# Patient Record
Sex: Female | Born: 1935 | Race: White | Hispanic: No | Smoking: Never smoker
Health system: Southern US, Community
[De-identification: ages and names within clinical notes are randomized; demographics above are authoritative.]

## PROBLEM LIST (undated history)

## (undated) DIAGNOSIS — M810 Age-related osteoporosis without current pathological fracture: Secondary | ICD-10-CM

## (undated) DIAGNOSIS — K219 Gastro-esophageal reflux disease without esophagitis: Secondary | ICD-10-CM

## (undated) DIAGNOSIS — I351 Nonrheumatic aortic (valve) insufficiency: Secondary | ICD-10-CM

## (undated) DIAGNOSIS — I77810 Thoracic aortic ectasia: Secondary | ICD-10-CM

## (undated) DIAGNOSIS — I350 Nonrheumatic aortic (valve) stenosis: Secondary | ICD-10-CM

## (undated) DIAGNOSIS — I2510 Atherosclerotic heart disease of native coronary artery without angina pectoris: Secondary | ICD-10-CM

## (undated) DIAGNOSIS — E785 Hyperlipidemia, unspecified: Secondary | ICD-10-CM

## (undated) DIAGNOSIS — G709 Myoneural disorder, unspecified: Secondary | ICD-10-CM

## (undated) DIAGNOSIS — R42 Dizziness and giddiness: Secondary | ICD-10-CM

## (undated) DIAGNOSIS — Z8719 Personal history of other diseases of the digestive system: Secondary | ICD-10-CM

## (undated) DIAGNOSIS — I10 Essential (primary) hypertension: Secondary | ICD-10-CM

## (undated) DIAGNOSIS — T8859XA Other complications of anesthesia, initial encounter: Secondary | ICD-10-CM

## (undated) DIAGNOSIS — M1990 Unspecified osteoarthritis, unspecified site: Secondary | ICD-10-CM

## (undated) DIAGNOSIS — E039 Hypothyroidism, unspecified: Secondary | ICD-10-CM

## (undated) DIAGNOSIS — I272 Pulmonary hypertension, unspecified: Secondary | ICD-10-CM

## (undated) DIAGNOSIS — T4145XA Adverse effect of unspecified anesthetic, initial encounter: Secondary | ICD-10-CM

## (undated) HISTORY — DX: Hyperlipidemia, unspecified: E78.5

## (undated) HISTORY — DX: Essential (primary) hypertension: I10

## (undated) HISTORY — PX: ESOPHAGOGASTRODUODENOSCOPY: SHX1529

## (undated) HISTORY — PX: TONSILLECTOMY: SUR1361

## (undated) HISTORY — DX: Thoracic aortic ectasia: I77.810

## (undated) HISTORY — DX: Nonrheumatic aortic (valve) insufficiency: I35.1

## (undated) HISTORY — PX: BACK SURGERY: SHX140

## (undated) HISTORY — DX: Pulmonary hypertension, unspecified: I27.20

## (undated) HISTORY — DX: Age-related osteoporosis without current pathological fracture: M81.0

## (undated) HISTORY — DX: Nonrheumatic aortic (valve) stenosis: I35.0

## (undated) HISTORY — PX: CARDIAC CATHETERIZATION: SHX172

---
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
## 1998-09-14 HISTORY — PX: FLEXIBLE SIGMOIDOSCOPY: SHX1649

## 1998-09-22 ENCOUNTER — Ambulatory Visit (HOSPITAL_COMMUNITY): Admission: RE | Admit: 1998-09-22 | Discharge: 1998-09-22 | Payer: Self-pay | Admitting: Gastroenterology

## 1999-02-16 ENCOUNTER — Ambulatory Visit (HOSPITAL_COMMUNITY): Admission: RE | Admit: 1999-02-16 | Discharge: 1999-02-16 | Payer: Self-pay | Admitting: Obstetrics & Gynecology

## 2000-01-30 ENCOUNTER — Other Ambulatory Visit: Admission: RE | Admit: 2000-01-30 | Discharge: 2000-01-30 | Payer: Self-pay | Admitting: Family Medicine

## 2005-08-14 HISTORY — PX: CORONARY ARTERY BYPASS GRAFT: SHX141

## 2005-12-12 HISTORY — PX: CORONARY ARTERY BYPASS GRAFT: SHX141

## 2006-01-05 ENCOUNTER — Encounter (INDEPENDENT_AMBULATORY_CARE_PROVIDER_SITE_OTHER): Payer: Self-pay | Admitting: Cardiology

## 2006-01-05 ENCOUNTER — Inpatient Hospital Stay (HOSPITAL_COMMUNITY): Admission: AD | Admit: 2006-01-05 | Discharge: 2006-01-17 | Payer: Self-pay | Admitting: Cardiology

## 2006-02-08 ENCOUNTER — Encounter (HOSPITAL_COMMUNITY): Admission: RE | Admit: 2006-02-08 | Discharge: 2006-05-09 | Payer: Self-pay | Admitting: Cardiology

## 2006-03-28 ENCOUNTER — Inpatient Hospital Stay (HOSPITAL_COMMUNITY): Admission: EM | Admit: 2006-03-28 | Discharge: 2006-03-30 | Payer: Self-pay | Admitting: Emergency Medicine

## 2006-03-29 ENCOUNTER — Encounter (INDEPENDENT_AMBULATORY_CARE_PROVIDER_SITE_OTHER): Payer: Self-pay | Admitting: Interventional Cardiology

## 2006-05-10 ENCOUNTER — Encounter (HOSPITAL_COMMUNITY): Admission: RE | Admit: 2006-05-10 | Discharge: 2006-08-08 | Payer: Self-pay | Admitting: Cardiology

## 2006-08-16 ENCOUNTER — Encounter: Admission: RE | Admit: 2006-08-16 | Discharge: 2006-08-16 | Payer: Self-pay | Admitting: Cardiology

## 2007-04-24 ENCOUNTER — Encounter
Admission: RE | Admit: 2007-04-24 | Discharge: 2007-07-23 | Payer: Self-pay | Admitting: Physical Medicine & Rehabilitation

## 2007-04-25 ENCOUNTER — Ambulatory Visit: Payer: Self-pay | Admitting: Physical Medicine & Rehabilitation

## 2007-04-30 ENCOUNTER — Encounter
Admission: RE | Admit: 2007-04-30 | Discharge: 2007-06-17 | Payer: Self-pay | Admitting: Physical Medicine & Rehabilitation

## 2007-06-07 ENCOUNTER — Ambulatory Visit: Payer: Self-pay | Admitting: Physical Medicine & Rehabilitation

## 2007-12-30 IMAGING — CR DG CHEST 1V PORT
1 series · 1 of 1 positions shown · non-contrast
Comparison: 01/11/06.

CLINICAL DATA: CABG.
 PORTABLE CHEST - 1 VIEW (2062 hours):

[view not recorded]
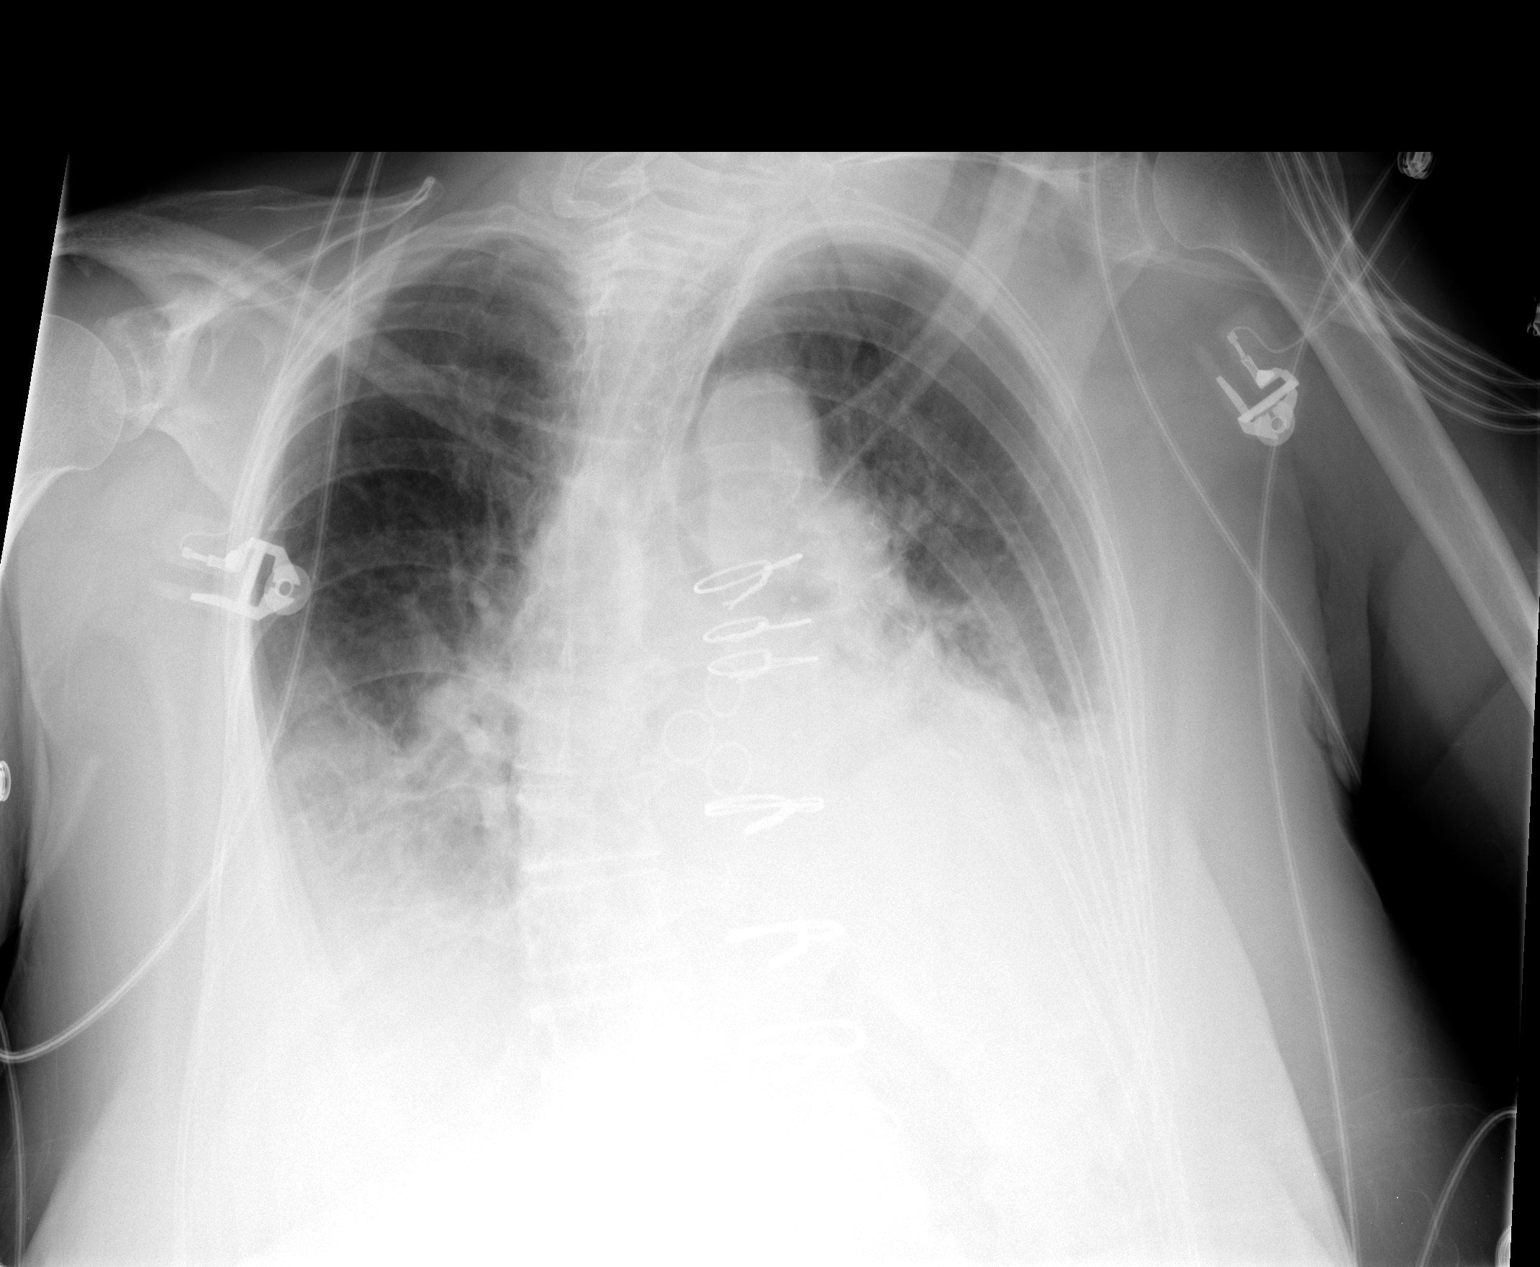

[1 of 1 positions shown; findings below may reference images not displayed]

FINDINGS: Left chest tube has been removed. The patient has developed heart failure with increasing edema and effusion.  There is increase in bibasilar atelectasis.
IMPRESSION: Interval increase in edema and bibasilar effusion and atelectasis compatible with heart failure.

## 2008-01-02 IMAGING — CR DG CHEST 2V
2 series · 2 of 2 positions shown · non-contrast
Comparison: 01/14/06.

CLINICAL DATA: Chest pain/post-CABG.
 CHEST - 2 VIEW:

[w chest pa]
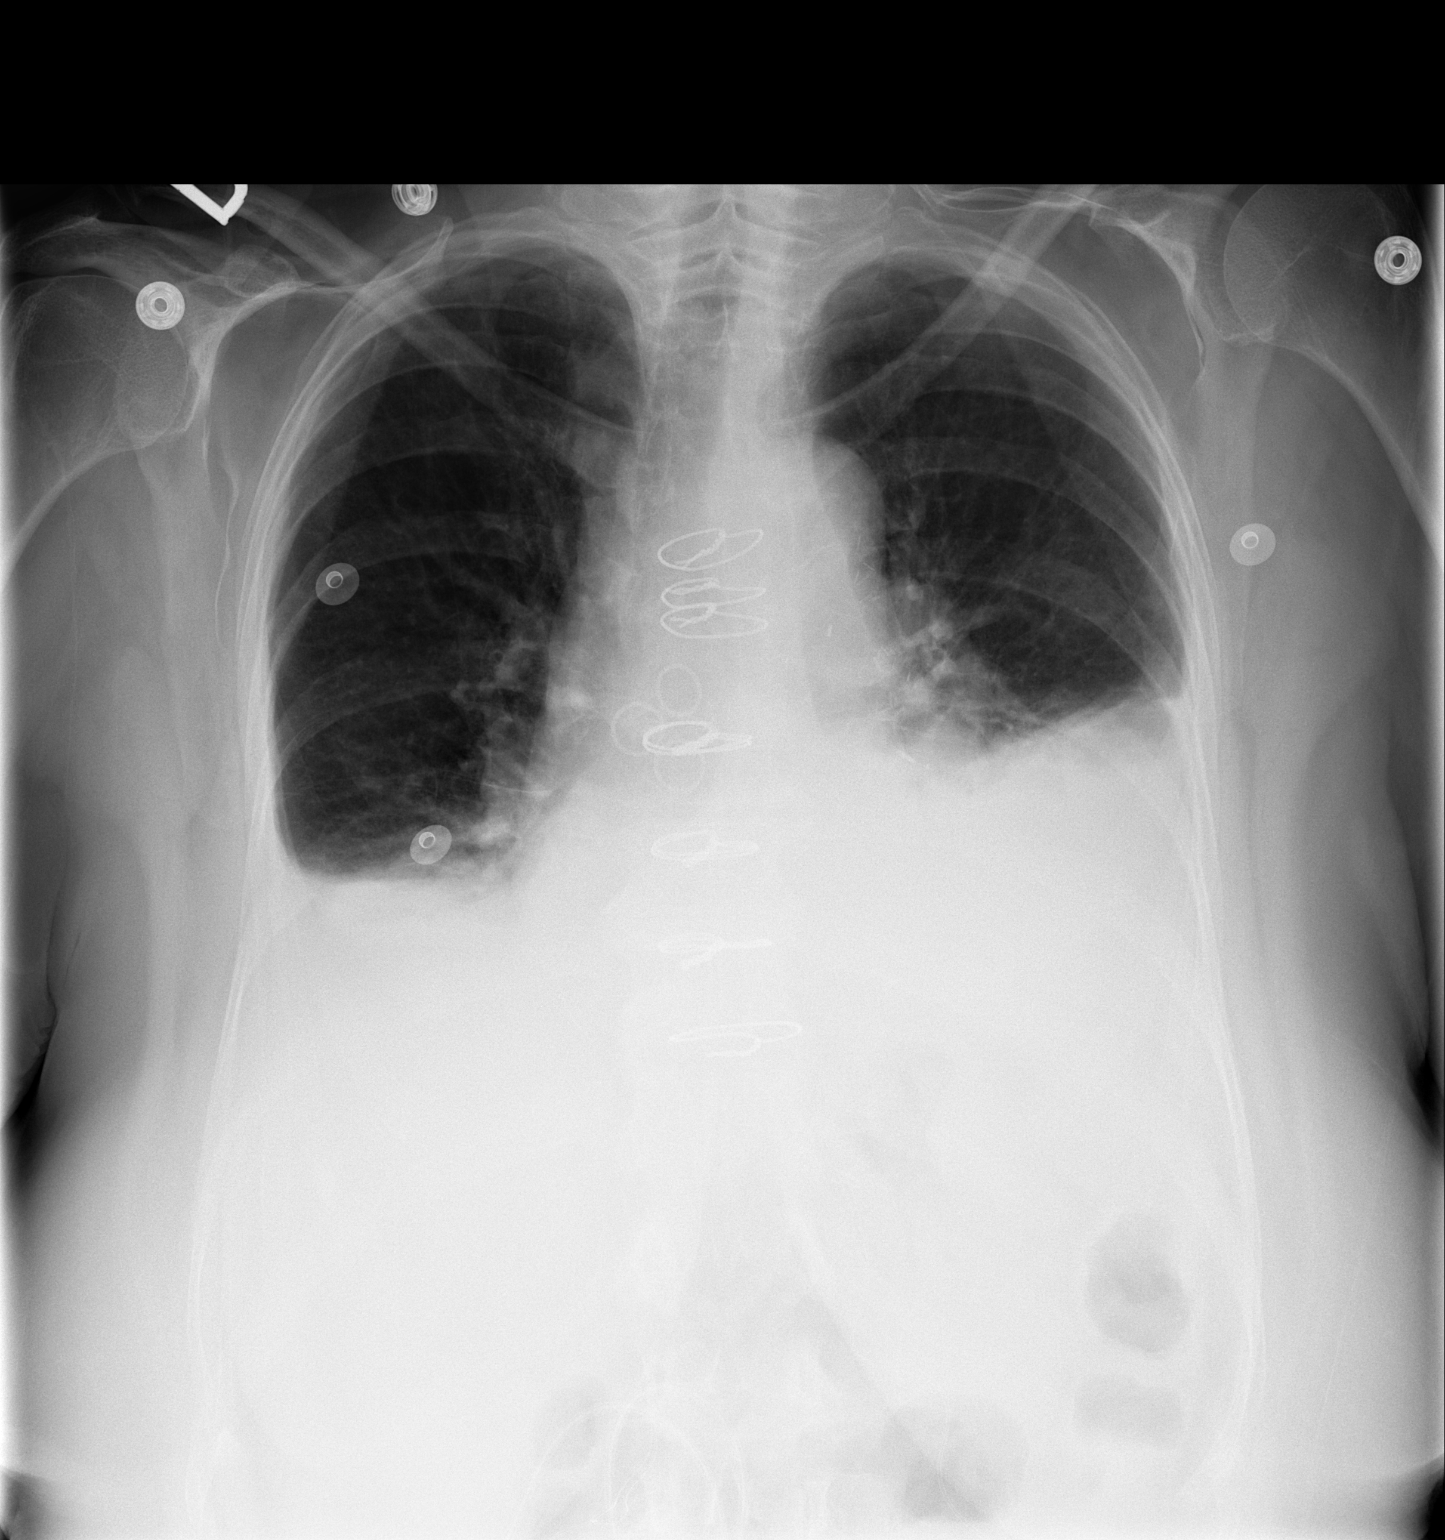

[w chest lat]
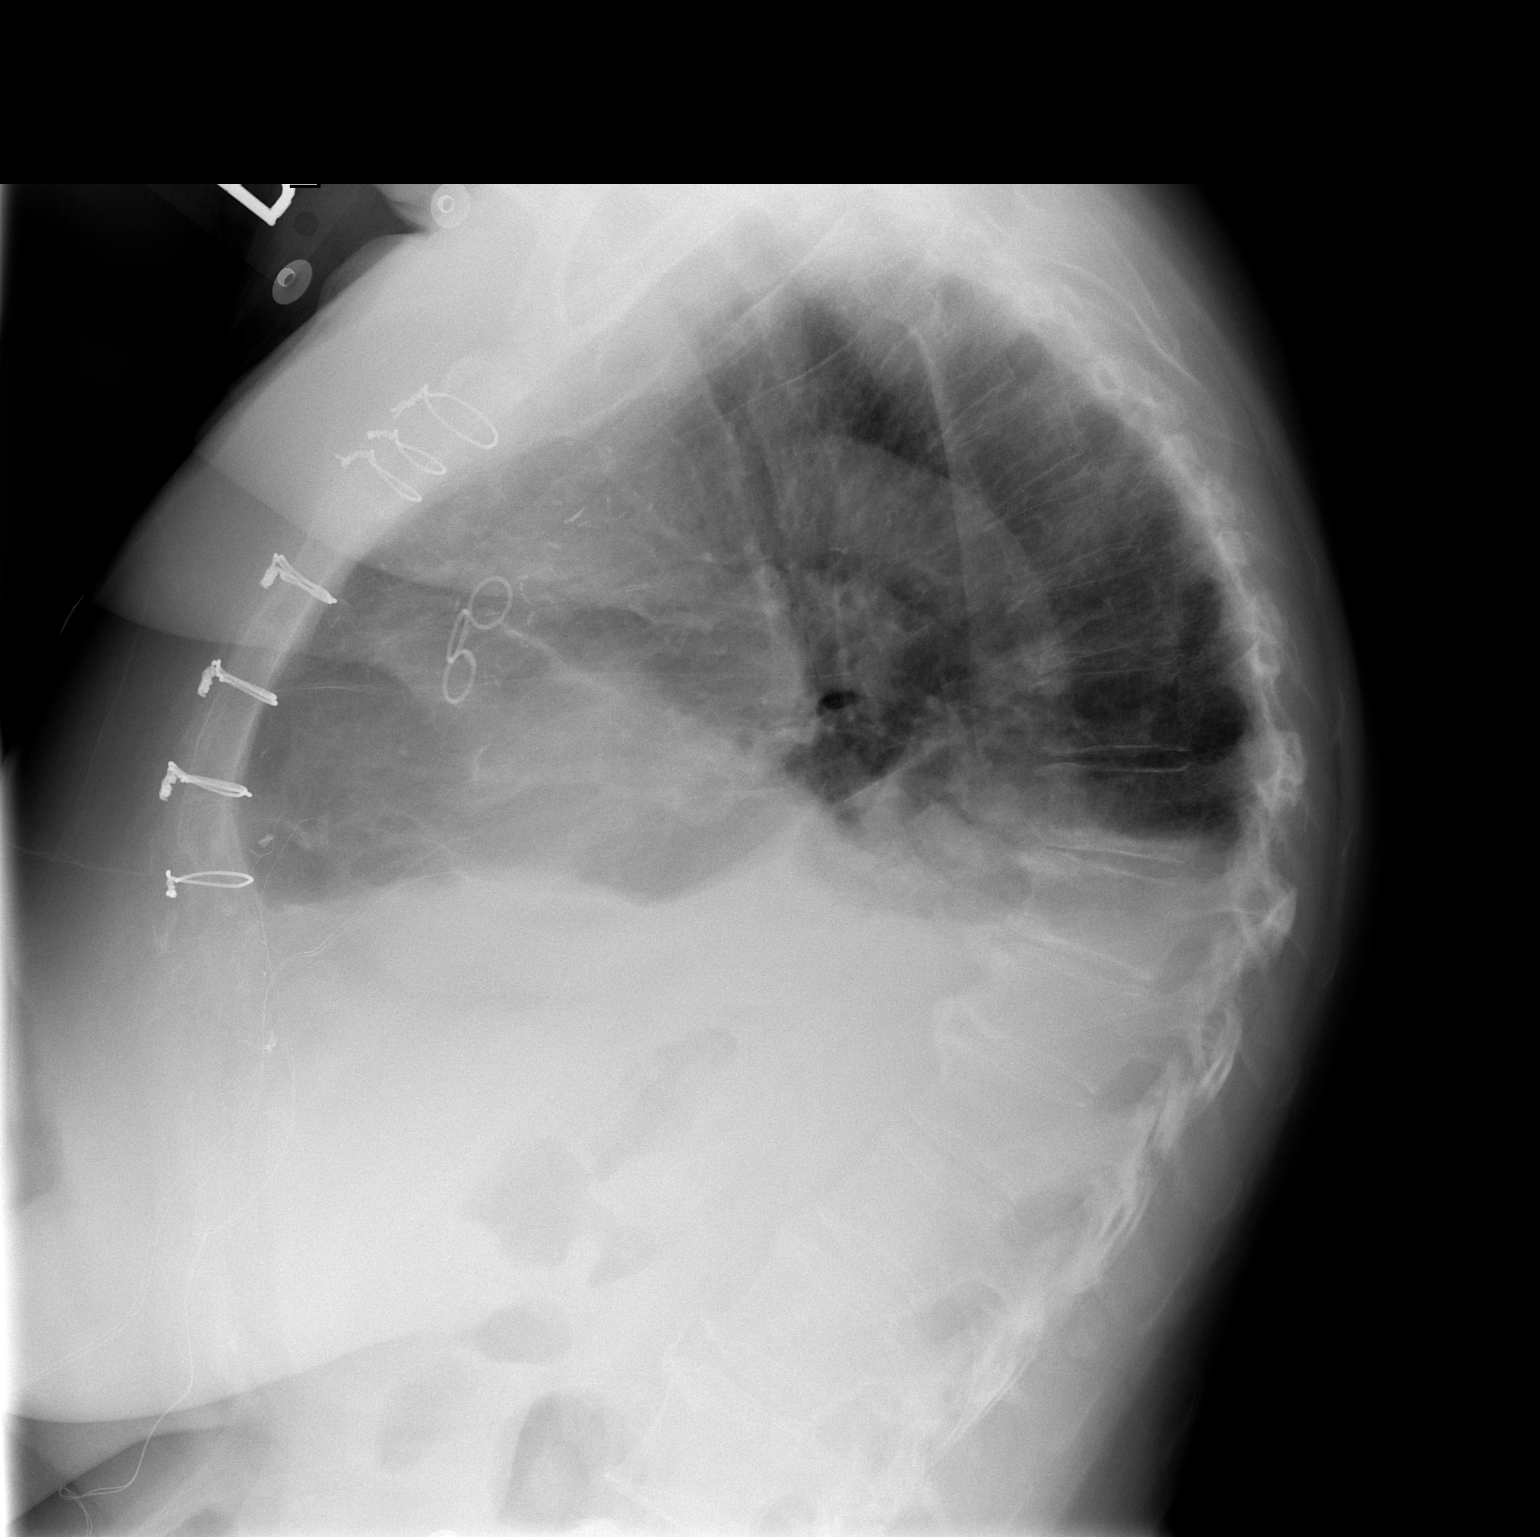

[2 of 2 positions shown; findings below may reference images not displayed]

FINDINGS: Bilateral basilar opacities with pleural effusions persist.  There is no definite congestive heart failure.
IMPRESSION: 1.  Bilateral effusions with basilar atelectasis or pneumonia.
 2.  No current congestive heart failure.

## 2008-01-04 IMAGING — CR DG CHEST 2V
2 series · 2 of 2 positions shown · non-contrast
Comparison: 01/15/06.

CLINICAL DATA: Status-post CABG.

[view not recorded (1 of 2)]
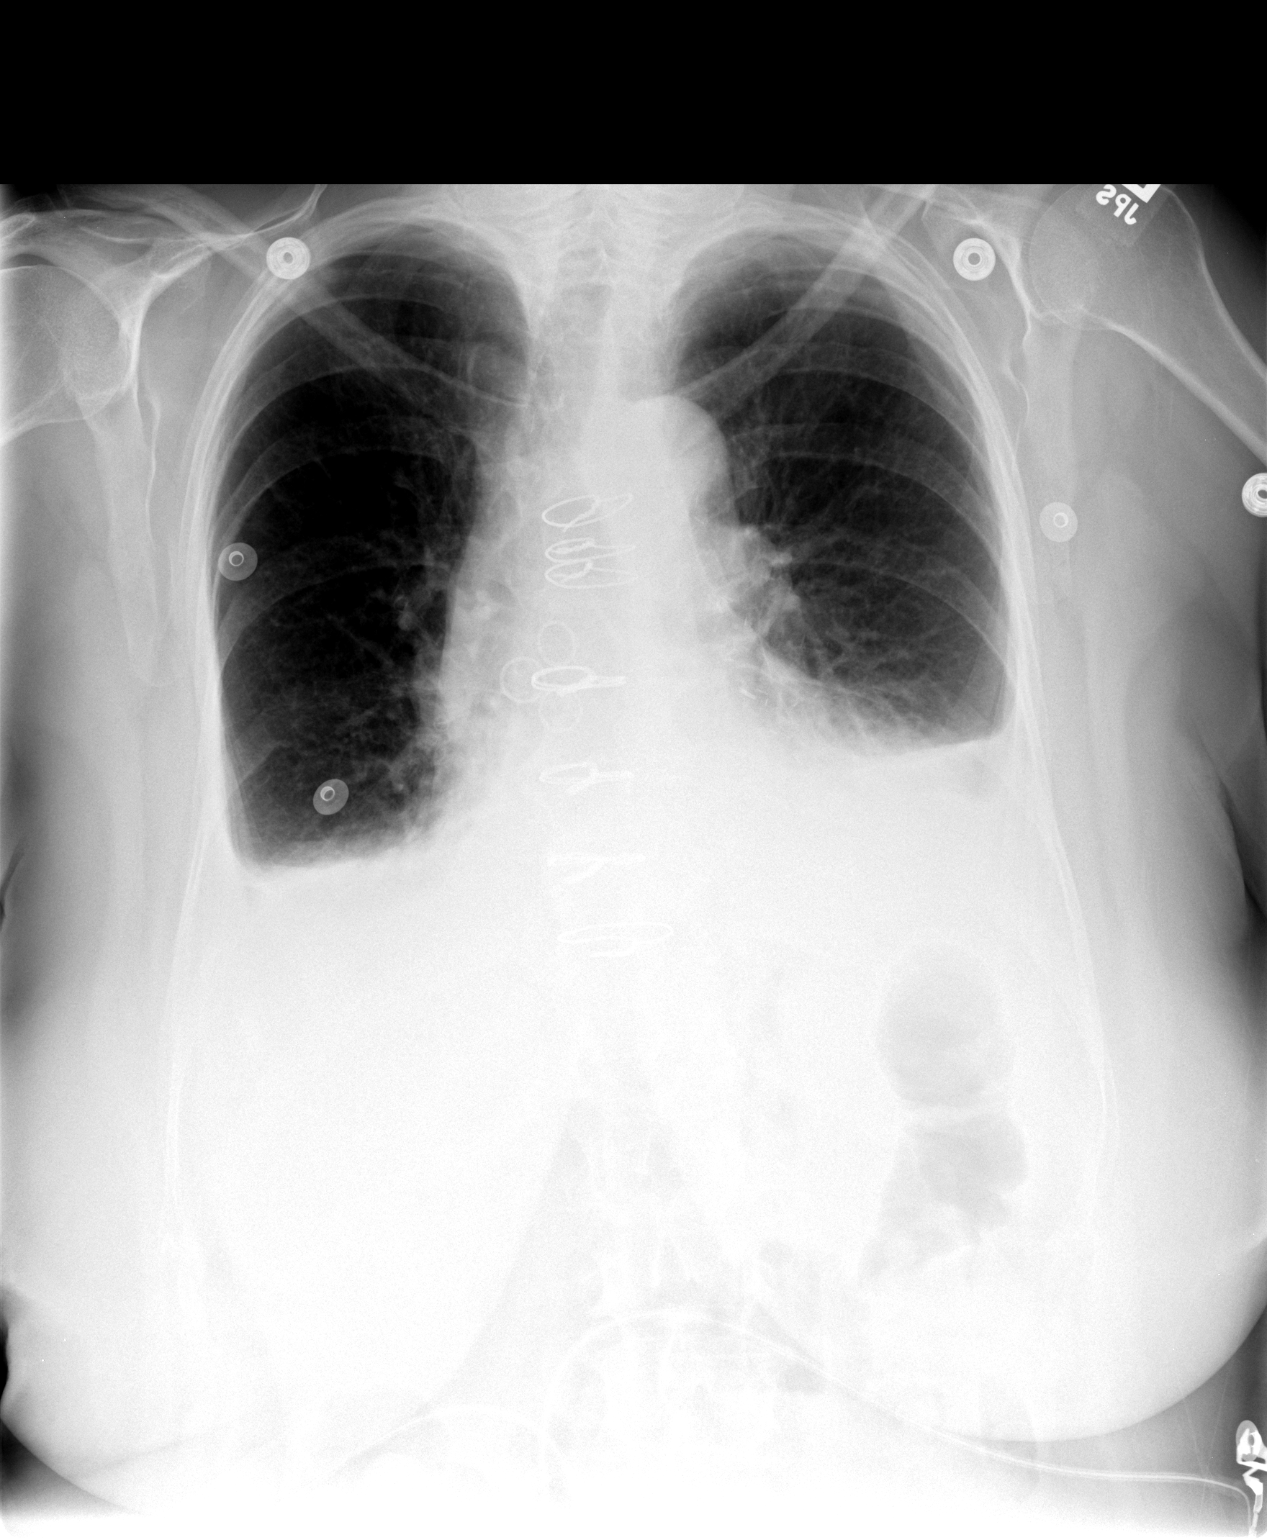

[view not recorded (2 of 2)]
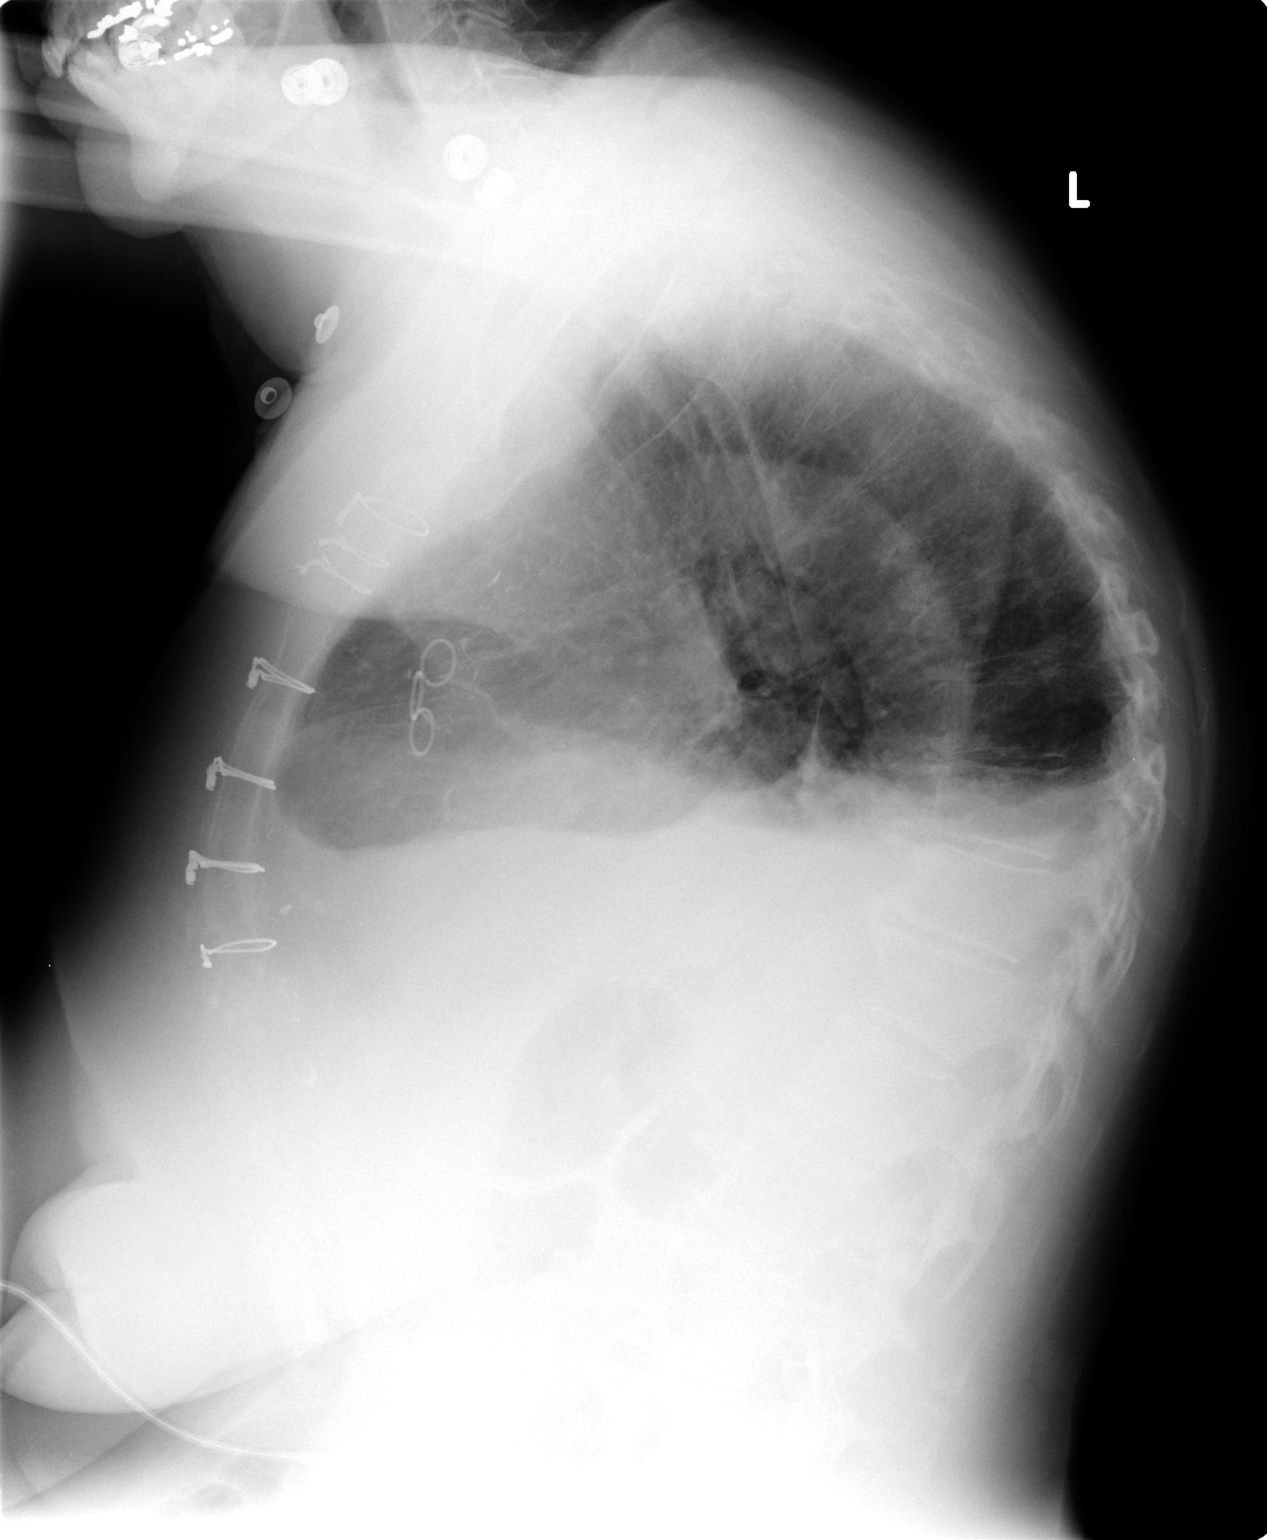

[2 of 2 positions shown; findings below may reference images not displayed]

FINDINGS: There has been slight interval decrease in patient?s left pleural effusion.  Bilateral effusions persist.  No edema.  Bibasilar atelectasis.  Patient is status-post CABG.
IMPRESSION: Persistent bilateral pleural effusions with slight decrease in the patient?s left effusion.  No other change.

## 2009-03-06 ENCOUNTER — Encounter: Admission: RE | Admit: 2009-03-06 | Discharge: 2009-03-06 | Payer: Self-pay | Admitting: Orthopedic Surgery

## 2010-03-24 ENCOUNTER — Encounter: Admission: RE | Admit: 2010-03-24 | Discharge: 2010-04-11 | Payer: Self-pay | Admitting: Neurology

## 2010-12-27 NOTE — Assessment & Plan Note (Signed)
A 77 year old female with history of post-thoracotomy syndrome I saw in  initial evaluation April 25, 2007.  She started physical therapy  with lidocaine patches over the sternal area, and she was doing very  well with this.  In fact, her chest pain has improved.  She can take a  deep breath without pain.  She can look up and down and pull her arms  back behind her without pain.  Her main complaint today is she was  digging through some cabinets and injured her back.  Her back pain is  recent onset about 4 days ago.  Does not have any lower extremity  symptoms.  No bowel or bladder dysfunction.  Pain currently 7/10 and  described as throbbing and aching.  Sleep is good.  Activity level, she  is independent with all self care.   EXAMINATION:  Blood pressure 128/60, pulse 58, respirations 18, O2  saturation 97% on room air.  GENERAL:  In no acute distress.  Mood and affect appropriate.  Alert and  oriented x3.  Gait is mildly antalgic.   Her back has some tenderness mainly in the right paraspinal and the  lumbar areas.  She has limited forward flexion and extension, although  extension is less painful than forward flexion.  She has about 50% range  of each.  Her upper and lower extremity strength are normal.  She has no  tenderness along the chest or sternal area.   IMPRESSION:  1. Lumbar strain, doubt that she has any type of compression fractures      based on the mechanism of injury.  She really was not bending      forward, more or less almost lying on the floor and reaching.  2. Post thoracotomy syndrome, improved.  Will continue physical      therapy.  No need for additional procedures, such as acupuncture.      Will continue Lidoderm patch.   I will see her back in about 2 to 3 weeks, given the acuity of her back  pain.  She is really quite limited with this right now, and if she does  not really improve much over the next couple weeks, we will check x-ray  of her lumbar  spine.      Erick Colace, M.D.  Electronically Signed     AEK/MedQ  D:  05/24/2007 15:49:49  T:  05/25/2007 11:28:04  Job #:  [id number]   cc:   Armanda Magic, M.D.  Fax: [phone]   Kerin Perna, M.D.  [address]  [place]  [place] [address]   Anna Genre. Little, M.D.  Fax: [phone]

## 2010-12-27 NOTE — Assessment & Plan Note (Signed)
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 She has been going through physical  therapy at [facility]. She has been having  some improvements in her thoracotomy pain up until a few days ago when  she feels like she pulled something on the left side of the chest. In  addition, she has some flare up of some back pain, but this has subsided  over the last week or two. I saw her last on May 25, 2007. At that  time, she was having the flare up of back pain which has now improved.   CURRENT MEDICATIONS:  She uses Lidoderm patch over the chest. She is  wondering whether she could use it over the left side of her chest or  whether this poses a danger to her. Her pain level is a 3/10 average,  but interferes with activity at an 8/10 level. She is able to climb  steps. He drives. She is retired.   PHYSICAL EXAMINATION:  Blood pressure 140/73, pulse 57, respiratory rate  is 18, O2 sats 97% on room air.  In general, in no acute distress.  Her chest has tenderness along the sternal costal junctions on the left  side. No pain right over the sternum. No pain over the xyphoid or  manubrium. She has mild increased pain with extension of the lumbar  spine. She does have a kyphotic posture, but her posture has overall  improved and she has reportedly gained a quarter inch in height due to  improved posture.  Upper and lower extremity strength is good. Gait is without evidence of  toe drag or knee instability.   IMPRESSION:  1. Post thoracotomy pain syndrome.  2. Overall, improving. Some exacerbation on the left side.   PLAN:  1. Use the Lidoderm patch over that area.  2. Lumbar pain, spondylosis. This is also improved.  3. I will see her back in about three weeks. Continue physical therapy      until that time. Went over how to properly use Lidoderm patch in      terms of dosing of on 12 and off  12.      Erick Colace, M.D.  Electronically Signed     AEK/MedQ  D:  06/10/2007 14:19:58  T:  06/10/2007 21:11:41  Job #:  [id number]

## 2010-12-27 NOTE — Group Therapy Note (Signed)
This consultation was requested by Dr. Armanda Magic in regards to chest  pain post coronary bypass surgery.   HISTORY:  The patient is a 77 year old female who had about a three  month history of intermittent angina with activity related pain starting  around February 2007. She states that she ignored it, but she eventually  sought medical attention and underwent four vessel coronary artery  bypass grafting per Dr. Donata Clay. The patient had no immediate  postoperative complications. She postoperatively had a chest x-ray on  6/21 demonstrating left greater right bibasilar atelectasis, small  plural effusions, chronic obstructive pulmonary disease, slight  cardiomegaly, as well as dorsal kyphosis. She has had echocardiography  performed on 04/24/2006 showing LV size and LV function with trivial  regurgitation and mild tricuspid regurgitation. She did have a  hospitalization on 03/28/2006 to 03/30/2006 which is approximately six  weeks postoperative for chest pain across her entire precordium. Cardiac  enzymes were negative. A 2D echocardiogram with normal EF. D-Dimer  mildly elevated. TT angiogram of the chest no pulmonary embolism.  Because of GI upset related to a large hiatal hernia, she has been  reluctant to use any non-steroidal antiinflammatories, or unable to take  them. She has not had any physical therapy recently. She did have some  in the immediate postoperative timeframe. She had follow up with  cardiothoracic surgery and no chest wall instability, or problems with  the sternotomy healing were noted.   Her main pain exacerbating factors are deep breath. Her pain is  describing as constant, aching, and like a vise. Her average pain is  about 5 out of 10. It is currently 3 to 4 out of 10, and is interfering  with life enjoyment at a 5 out of 10 level, and general activity at a  moderate level. Laying flat in bed seems to cause pain, however some of  this is her GI related pain.  She sleeps on her side generally. She can  walk 30 minutes at a time. She does swimming at a club. She climbs  steps. She drives.   REVIEW OF SYSTEMS:  Positive for shortness of breath. This is mainly  when she takes a deep breath, she feels like that she cannot take a full  breath due to pain.   PAST MEDICAL HISTORY:  As noted above. She has had back surgery in the  1980s. There is really no back pain.   SOCIAL HISTORY:  She is married and lives with her husband who has had  some illness recently himself.   FAMILY HISTORY:  Significant for heart disease.   PHYSICAL EXAMINATION:  VITAL SIGNS:  Blood pressure 110/62, pulse 67,  respirations 18, O2 saturation 97% on room air.  GENERAL:  No acute distress. Mood and affect are appropriate.  Orientation x3. Gait is normal. She is able to toe walk heel walk. She  has normal strength in bilateral upper and lower extremities. She has  kyphotic posture, increased AP diameter of the chest wall. She has good  lumbar spine range of motion. She has normal range of motion of her  upper and lower extremities. Normal deep tendon reflexes. Normal  sensation.  NECK:  Range of motion is painful with extension of her neck, but  otherwise full range.  LUNGS:  Clear to auscultation.  HEART:  Regular rate and rhythm.  CHEST:  Tenderness along the manubrium and upper sternum. No tenderness  along the sternal clavicular area. She does have tenderness which  is  more diffuse just lateral to the upper sternum. She has some scar  hypersensitivity once again in the upper part of her sternum. It is  adherent scar and not mobile.   IMPRESSION:  Post-thoracotomy syndrome appears to be more in the upper  part along the manubrium as well. I do not feel that she has any  significant sternoclavicular pain. No pain over the xyphoid.   I discussed the treatment options. Certainly, she is reluctant to take  any type of p.o. medications given she has some scar  hypersensitivity, I  have recommended a Lidoderm patch over the area to put it on during the  morning and take it off in the evening. I have also recommended physical  therapy and we will set this up at the [facility]. Location to  do scar desensitization and scar mobilization, as well as increasing  thoracic mobility. Work on posture, although to a certain degree, I  think her posture may be fixed in a kyphotic position. This is certainly  exacerbating her overall problems.   I advised her to take some Tylenol prior to physical therapy. She could  also take it prior to any kind of activity that seems to provoke it if  she takes deep breaths such as during exercise.   We discussed other potential treatment options including scar injection  with Lidocaine or acupuncture, but I would only reserve these in the  event that the above treatment is not successful.   Thank you very much for this interesting consultation.      Erick Colace, M.D.  Electronically Signed     AEK/MedQ  D:  04/25/2007 12:55:08  T:  04/26/2007 05:45:03  Job #:  [id number]   cc:   Kerin Perna, M.D.  [address]  [place]  [place] [address]   Anna Genre. Little, M.D.  Fax: [phone]   Llana Aliment. Malon Kindle., M.D.  Fax: [phone]

## 2010-12-30 NOTE — Cardiovascular Report (Signed)
Kimberly [name], Kimberly [name]               ACCOUNT NO.:  [id number]   MEDICAL RECORD NO.:  [id number]          PATIENT TYPE:  INP   LOCATION:  2032                         FACILITY:  [facility]   PHYSICIAN:  Armanda Magic, M.D.     DATE OF BIRTH:  [birth year]   DATE OF PROCEDURE:  01/04/2006  DATE OF DISCHARGE:  01/17/2006                              CARDIAC CATHETERIZATION   REFERRING PHYSICIAN:  Dr. Ernestene Kiel.   This is a 77 year old female with a history of exertional chest pain and  dyslipidemia.  She now presents for cardiac catheterization.   The patient was brought to cardiac catheterization laboratory in the fasting  nonsedated state.  Informed consent was obtained.  The patient was connected  to continuous heart rate and pulse oximetry monitoring and intermittent  blood pressure monitoring.  The right groin was prepped and draped in a  sterile fashion.  One percent Xylocaine was used for local anesthesia.  Using a modified Seldinger technique, a 6-French sheath was placed in right  femoral artery.  Under fluoroscopic guidance, a 6-French JL-4 catheter was  placed in the left coronary artery.  Multiple cine films were taken in 30-  degree RAO and 40-degree LAO views.  This catheter was then exchanged out  over a guidewire for a 6-French JR-4 catheter which was placed under  fluoroscopic guidance in the right coronary artery.  Multiple cine films  were taken in 30-degree RAO and 40-degree LAO views.  This catheter was then  exchanged out over a guidewire for 6-French angled pigtail catheter which  was placed under fluoroscopic guidance in the left ventricular cavity.  Left  ventriculography was performed in 30-degree RAO view using a total of 30 mL  contrast at 15 mL per second.  Catheter was then pulled back across the  aortic valve with no significant gradient noted.  At the end procedure, all  catheters and sheaths were removed.  Manual compression was performed until  adequate  hemostasis was obtained.  The patient was transferred back to her  room in stable condition.   RESULTS:  The left main coronary artery is widely patent and trifurcates  into the left anterior descending artery, ramus branch and left circumflex.   The left anterior descending artery has a 60% proximal stenosis and then  gives rise to a very large diagonal 1 branch.  The diagonal has a 99% ostial  stenosis and then bifurcates into 2 daughter branches, the superior branch  and the inferior branch; both are severely diseased up to 95-99%.  Just  distal to the takeoff of first diagonal, there is a 90% stenosis of the mid  LAD.  The rest of the LAD is patent and traverses the apex.   The ramus branch is a moderate size vessel with sequential 99% stenoses and  then bifurcates into 2 daughter branches, both of which are widely patent.   The left circumflex traverses the AV groove, gives rise to 2 small obtuse  marginal 1 abd obtuse marginal 2 branches, both of which are widely patent.  The obtuse marginal 3 is occluded in  the ostium with evidence of left-to-  left collaterals from the ramus filling the distal portion of the vessel  which bifurcates into 2 daughter branches.   The ongoing left circumflex traverses the AV groove and has a 70% narrowing  distally before giving rise to a fourth obtuse marginal branch which is  patent.   The right coronary artery is widely patent throughout its course, giving  rise to a posterior descending and posterior lateral artery vessels.  There  is evidence of right-to-left collaterals filling the obtuse marginal 3  branch.   Left ventriculography shows normal LV function.  Aortic pressure 156/69  mmHg, LV pressure 157/6 mmHg.   ASSESSMENT:  1. Severe two-vessel coronary disease.  2. Normal left ventricular function.   PLAN:  CVTS consult.  Continue aspirin.  Check a fasting lipid panel.  IV  heparin, nitroglycerin drip and beta blocker secondary to  decreased heart  rate.      Armanda Magic, M.D.  Electronically Signed     TT/MEDQ  D:  05/21/2006  T:  05/22/2006  Job:  [id number]

## 2010-12-30 NOTE — Op Note (Signed)
[name], [name] NO.:  [id number]   MEDICAL RECORD NO.:  [id number]          PATIENT TYPE:  INP   LOCATION:  2303                         FACILITY:  [facility]   PHYSICIAN:  Kimberly [name], M.D.  DATE OF BIRTH:  08-Oct-1930   DATE OF PROCEDURE:  01/10/2006  DATE OF DISCHARGE:                                 OPERATIVE REPORT   OPERATION:  Coronary bypass grafting x4 (left internal mammary artery to  LAD, saphenous vein graft to diagonal, saphenous vein graft to ramus  intermediate, saphenous vein graft to obtuse marginal).   PRE AND POSTOPERATIVE DIAGNOSIS:  Class IV unstable angina with severe three-  vessel equivalent coronary disease.   SURGEON:  Kimberly [name], M.D.   ASSISTANT:  Kimberly Crane PA-C.   ANESTHESIA:  General by Dr. Laverle [name].   INDICATIONS:  The patient is a 77 year old female who presented with  unstable angina, and ruled out for myocardial infarction.  She underwent  cardiac catheterization by Dr. Carolanne [name] which demonstrated severe  multivessel coronary disease with preserved LV systolic function.  She was  not felt to be candidate for percutaneous intervention due to the small  vessel size and extended the length of her coronary stenoses.  I saw the  patient in consultation and reviewed results of her cardiac cath with the  patient and family.  I discussed indications and expected benefits of  coronary bypass surgery for treatment of her coronary disease.  I reviewed  the alternatives to her, other than surgery, as well.  I discussed with the  patient the major details of the planned operation, including the choice of  conduit to include mammary artery and endoscopically harvested saphenous  vein, the location of surgical incisions, the use of general anesthesia and  cardiopulmonary bypass, and the expected postoperative hospital recovery.  I  discussed with the patient the risks to her of coronary bypass surgery,  including risks  of MI, CVA, bleeding, infection, and death.  She understood  these implications for the surgery and agreed to proceed with the operation  as planned under what I felt was an informed consent.   OPERATIVE FINDINGS:  The vein was harvested endoscopically from both legs.  The patient received a unit of packed cells while on bypass for a hemoglobin  of 7 grams.  She received a unit of platelets at the end of the procedure,  following reversal of heparin with protamine due to persistent coagulopathy.  The distal circumflex was not a graftable vessel.  Otherwise, the vein was  of good quality and the mammary artery was a small vessel with excellent  flow.  The LAD was deeply intramyocardial, and it was too small the graft  distally and it was dissected in the intramyocardial location more  proximally where it was a good vessel and a good target.   PROCEDURE:  The patient was brought to the operating room and placed supine  on the operating room table.  General anesthesia was induced under invasive  hemodynamic monitoring.  The chest, abdomen and legs were prepped with  Betadine and  draped as a sterile field.  A sternal incision was made as the  saphenous vein was harvested endoscopically from each upper leg.  The left  internal mammary artery was harvested as a pedicle graft from its origin at  the subclavian vessels and it was a good vessel with excellent flow.  Heparin was administered and the ACT was documented as being therapeutic.  The sternal retractor was placed and the pericardium was opened and  elevated.  Pursestrings were placed in the ascending aorta and right atrium,  and the patient was cannulated placed on bypass.  The coronaries were  identified for grafting and cardioplegia catheters were placed for both  antegrade aortic and retrograde coronary sinus cardioplegia.  The patient  was cooled to 30 degrees.  The mammary artery and vein grafts were prepared  for the distal  anastomoses.  The aortic crossclamp was applied.  800 mL of  cold blood cardioplegia was delivered in split doses between the antegrade  aortic and retrograde coronary sinus cardioplegia catheters.  There was good  cardioplegic arrest and septal temperature dropped less than 12 degrees.  Topical iced saline slush was used to augment myocardial preservation and a  pericardial insulator pad was used protect left phrenic nerve.   The distal coronary anastomoses were then performed.  The first distal  anastomosis was to the diagonal branch of the LAD.  There was a heavily  diseased and somewhat small, 1.2 mm, vessel with a 90% proximal stenosis.  Reverse saphenous vein of small caliber was sewn end-to-side with running 8-  0 Prolene.  There was good flow through graft.  The second distal  anastomosis was the ramus intermediate branch of the circumflex.  This was a  1.5 mm vessel with a proximal 90% stenosis.  Reverse saphenous vein sewn end-  to-side with running 8-0 Prolene.  There was good flow through graft.  Cardioplegia was redosed.  The third distal anastomosis was to the OM  branch, which had been occluded proximally.  This was a 1.5 mm vessel and  was a very reasonable target.  Reverse saphenous vein was sewn end-to-side  with running 7-0 Prolene.  There was excellent flow through the graft.  Cardioplegia was redosed.  The fourth distal anastomosis was to the proximal  third of the LAD, where it was intramyocardial om location.  The left IMA  pedicle was brought through an opening created in the left lateral  pericardium and was brought down onto the LAD and sewn end-to-side with  running 8-0 Prolene.  There was excellent flow through the anastomosis,  after briefly releasing the pedicle bulldog on the mammary artery.  The  bulldog was replaced and the pedicle secured to the epicardium.  Cardioplegia was redosed.  While the crossclamp was still in place, three proximal vein  anastomoses  were placed on the ascending aorta using a 4.0 mm punch and running 7-0  Prolene.  Prior to tying down the final proximal anastomosis, air was vented  from the coronaries and left side of heart using a dose of retrograde warm  blood cardioplegia.  The proximal anastomoses were then tied and the  crossclamp was removed.   The heart resumed a spontaneous rhythm and did not require cardioversion.  Air was aspirated from the vein grafts with 27-gauge needle, and each graft  was opened and had good flow.  The patient was rewarmed to 37 degrees and  temporary pacing wires were applied.  When the patient was rewarmed and  reperfused, the lungs re-expanded and the ventilator was resumed.  The  patient was then weaned from bypass, being AV sequentially paced on low-dose  dopamine.  Blood pressure and cardiac output were stable, and the patient  had no difficulty whatsoever separating from bypass.  Protamine was  administered and there were no adverse reactions.  The cannulas were  removed.  The mediastinum was irrigated with warm antibiotic irrigation.  Leg incision was irrigated and closed in a standard fashion.  The superior  pericardial fat was closed over the aorta and vein grafts.  Two mediastinal  and left pleural  chest tube were placed and brought through separate incisions.  The sternum  was closed interrupted steel wire.  Pectoralis fascia and subcutaneous skin  were closed with running Vicryl.  The skin was closed with in a  subcuticular.  Total bypass time was 150 minutes with a crossclamp time of  94 minutes.      Kimberly [name], M.D.  Electronically Signed     PV/MEDQ  D:  01/10/2006  T:  01/10/2006  Job:  [id number]   cc:   [facility]   Armanda Magic, M.D.  Fax: [phone]

## 2010-12-30 NOTE — Discharge Summary (Signed)
Kimberly [name], Kimberly [name] NO.:  [id number]   MEDICAL RECORD NO.:  [id number]          PATIENT TYPE:  INP   LOCATION:  2032                         FACILITY:  [facility]   PHYSICIAN:  Kerin Perna, M.D.  DATE OF BIRTH:  Jan 08, 1931   DATE OF ADMISSION:  01/04/2006  DATE OF DISCHARGE:  01/17/2006                                 DISCHARGE SUMMARY   HISTORY OF PRESENT ILLNESS:  The patient is an extremely pleasant 77-year-  old white female with a prior history that includes dyslipidemia.  She was  in her usual state of health until approximately one month ago when she  started developing chest pain.  Of note, she does have a very large hiatal  hernia that is followed by Dr. Vilinda Boehringer and apparently he had recommended  that she get it fixed.  She does get some intermittent chest pressure with  the hiatal hernia but has never had an exertional chest pressure component  to this which she has noticed in the past month.  She noted that she would  get exertional chest pressure and occasionally have feelings of heaviness  going down the right arm over the past month.  There was no associated  shortness of breath, nausea, vomiting or diaphoresis.  The pain only  occurred with exertion. She did have several episodes over a couple of days  prior to admission that were described as rest pain.  The patient is  reportedly very active in water aerobics but at the end of her aerobics she  would have some chest heaviness to the point that she would have to stop and  she would recover and begin swimming again.  She also works at [employer] and has been working outside with the animals at [employer] and when she  goes on her walks to each of the different stations she also noted some  pressure in her chest as well.  She recently took a plane flight to visit  the zoo in the [place] in [place] and got chest pressure while walking around  there.  When she came back she got off the plane,  went down some steps and  then walked up the steps in the airport and got recurrent chest heaviness  that resolved with rest.  She also has been having some difficulty with  chronic hoarseness related to the reflux and hiatal hernia but says it seems  to be worse lately.  She was referred in consultation to Armanda Magic, M.D.  It was her recommendation that she proceed to the hospital for a cardiac  catheterization.  Of note, an electrocardiogram in the office at [facility] showed that she had a sinus bradycardia with 46 beats per minute  with evidence of left ventricular hypertrophy by voltage criteria and  nonspecific T wave abnormalities.  The QTC was 414 milliseconds.  She was  admitted to [facility] for further evaluation and treatment  including the catheterization.   PAST MEDICAL HISTORY:  1.  Large hiatal hernia.  2.  Dyslipidemia.   PAST SURGICAL  HISTORY:  Status post back surgery in the past.   ALLERGIES:  None.   MEDICATIONS PRIOR TO ADMISSION:  1.  Synthroid 50 mcg daily.  2.  Simvastatin 40 mg daily.  3.  Nexium 40 mg daily, (although she believes she was supposed to be taking      it twice a day).  4.  Aspirin 81 mg daily.  5.  Calcium.  6.  Reglan 10 mg q.h.s. and 10 mg t.i.d. with meals.   FAMILY/SOCIAL HISTORY, REVIEW OF SYSTEMS AND PHYSICAL EXAMINATION:  Please  see the history and physical done at the time of admission.   HOSPITAL COURSE:  The patient was admitted and taken to the cardiac  catheterization laboratory.  She was found to have severe multivessel  coronary artery disease and not a candidate for percutaneous coronary  intervention due to small vessel size and extensive length of the coronary  stenosis.  The patient was subsequently referred to Kerin Perna, M.D.  He reviewed the films.  The findings of note were 90% stenosis of the left  anterior descending, diagonal, 95% stenosis of the ramus intermediate and  total occlusion  of obtuse marginal 3 branch of the circumflex which filled  via collaterals.  There was an 80% stenosis of the distal posterior lateral  branch of the circumflex.  The right coronary artery was co-dominant and not  diseased.  The left ventricular end diastolic pressure was 12 mmHg and her  ejection fraction was 60%.  There was no evidence of aortic stenosis or  mitral regurgitation on the cardiac catheterization.  Her descending aorta  and arch appeared to be  mildly dilated.  She was felt to be a candidate for  surgical revascularization.  The patient was medically stabilized and the  procedure was scheduled.  On Jan 10, 2006 the patient was taken to the  operating room where she underwent the following procedure.  Coronary artery  bypass grafting x4. The following grafts were placed:  (1) left internal  mammary artery to the left anterior descending, (2) saphenous vein graft to  the ramus, (3) saphenous vein graft to the diagonal, (4) saphenous vein  graft to the obtuse marginal.  Of note, the distal circumflex was felt to be  too small to graft. The patient tolerated the procedure well and was taken  to the surgical intensive care unit in stable condition.   Postoperatively the patient has done quite well.  She initially did require  some pressor support but these were weaned without difficulty. She  additionally required significant diuresis but is responding well.  She will  require further as an outpatient.  She was weaned from the ventilator  without difficulty.  She has been weaned from oxygen.  She is noted to have  some small effusions which have been followed and she is doing well  clinically and responding well in regard to this with the diuresis.  She is  tolerating her routine activities commensurate for level of postoperative  convalescence using routine protocols.  All routine lines, monitors and drainage devices have been discontinued in the standard fashion.  Her   laboratory values do reveal a moderate postoperative anemia but this is  stable.  Most recent findings on labs showed her hemoglobin and hematocrit  on January 15, 2006 were 10 and 29 respectively.  Electrolytes, BUN and  creatinine, were all within normal limits.  Her cardiac rhythm has remained  a normal sinus without significant ectopy or dysrhythmias.  Her overall  status is felt to be stable for tentative discharge in the morning of January 17, 2006 pending morning rounds re-evaluation.   DISCHARGE MEDICATIONS:  1.  Aspirin 81 mg daily.  2.  Toprol XL 25 mg daily.  3.  Zocor 40 mg daily.  4.  Synthroid 50 mcg daily.  5.  She is to resume her Nexium at 40 mg twice daily.  6.  Lasix 40 mg daily x 10 days.  7.  K-Dur 20 mEq daily x 10 days.  8.  Reglan as previously.  9.  For pain, Ultram 50 mg one to two every 6 hours as needed.   DISCHARGE INSTRUCTIONS:  The patient received written instructions regarding  medications, activity, diet, wound care and followup.   FOLLOWUP:  Followup with Dr. Mayford Knife in two weeks.  Followup with Dr. Kathlee Nations Trigt on February 09, 2006 at 11:45 A.M.   FINAL DIAGNOSIS:  Severe three vessel coronary artery disease with unstable  angina on presentation, now status post surgical revascularization as  described.   OTHER DIAGNOSES:  1.  Large hiatal hernia with distal esophageal ulceration.  2.  Dyslipidemia.  3.  Postoperative anemia.  4.  History of previous lumbar disc injection.  5.  Hypothyroidism.      Rowe Clack, P.A.-C.      Kerin Perna, M.D.  Electronically Signed    WEG/MEDQ  D:  01/16/2006  T:  01/17/2006  Job:  [id number]   cc:   Dellis Anes. Idell Pickles, M.D.  Fax: [phone]

## 2010-12-30 NOTE — H&P (Signed)
Kimberly [name], Kimberly [name] NO.:  [id number]   MEDICAL RECORD NO.:  [id number]          PATIENT TYPE:  OBV   LOCATION:  1824                         FACILITY:  [facility]   PHYSICIAN:  Cassell Clement, M.D. DATE OF BIRTH:  [birth year]   DATE OF ADMISSION:  03/28/2006  DATE OF DISCHARGE:                                HISTORY & PHYSICAL   CHIEF COMPLAINT:  Chest pain.   HISTORY:  This is a 77 year old married Caucasian female admitted through  the emergency room with chest pain of 24 hours duration.  She has a history  of known ischemic heart disease and underwent coronary artery bypass graft  surgery on Jan 10, 2006 by Dr. Kathlee Nations Trigt.  She made a good recovery  and has been in a cardiac rehabilitation program doing well.  Yesterday, she  noted gradual onset of chest discomfort which continued during the night.  The pain persisted during the day today and she called Dr. Norris Cross office  late in the afternoon and he advised her to come to the emergency room to be  checked.  The pain is across the entire precordium.  There is no radiation  to the arms, neck or back.  There is no nausea and vomiting.  There is no  sweating.  The pain is definitely worse with the breathing.  The patient has  a low grade fever but no leukocytosis.  Se is mildly dyspneic.  She has had  no hemoptysis.  She has no prior history of blood clots.   Her family history reveals that mother had a history of enlarged heart and  died at age 65 with rheumatic heart disease.  Father died of cancer.   SOCIAL HISTORY:  Reveals that she is married.  Her husband is presently  being treated at [facility] with leukemia and is quite ill.  The patient is  married with four children.  The patient has never smoked and does not use  alcohol.   ALLERGIES:  She has no known drug allergies.   REVIEW OF SYSTEMS:  Her review of systems reveals that she does have a known  large hiatal hernia.  Dr. Carman Ching his her  gastroenterologist and has  advised surgery but she has declined.  She is controlling her symptoms with  Reglan and a careful diet.  Genitourinary history reveals no dysuria.  Respiratory reveals no sputum production.  The remainder of the review of  systems is negative in that in detail.   PHYSICAL EXAMINATION:  VITAL SIGNS:  Blood pressure is 115/70, pulse 80  regular with occasional PVCs.  GENERAL APPEARANCE:  Elderly woman in no acute distress.  HEENT: Negative.  Jugular venous pressure normal.  Carotids normal.  Thyroid  normal.  CHEST:  The chest is clear to auscultation with some decreased  breath sounds at the bases but no pleural friction rub heard.  HEART:  The heart reveals a quiet precordium.  The incision of the sternal  incision is well-healed from his CABG operation.  There is no audible  pericardial rub.  There is no gallop or murmur.  ABDOMEN:  The abdomen is soft and nontender without hepatosplenomegaly or  masses.  EXTREMITIES:  The extremities show no calf tenderness, no edema.  Denna Haggard'  sign negative.  She has good pedal pulses.   LABORATORY DATA:  Her electrocardiogram shows normal sinus rhythm with no  acute changes.  She has occasional PVCs.  Her chest x-ray shows mild  cardiomegaly, poor inspiratory effort with blunting of the angles.  There is  no congestive heart failure.  Labs in the emergency room include an elevated  D-dimer at 0.70.  CBC is normal.  C-MET is normal.  Troponin and CK-MB are  negative x1 so far.   IMPRESSION:  Chest pain, rule out myocardial infarction.  Rule out post  cardiotomy syndrome with pericarditis.  Rule out pulmonary embolus, rule out  viral illness with low grade fever.  1. Status post coronary artery bypass graft Jan 10, 2006.  2. History of a large hiatal hernia.   DISPOSITION:  Admit to Dr. Armanda Magic to telemetry.  Spiral CT scan of the  chest will be done tonight to rule out pulmonary embolism in view of  elevated  D-dimer and pleuritic component of pain.  We will also get serial  cardiac enzymes.  We will treat with IV nitroglycerin, IV heparin and  continue her on her Statin therapy, aspirin and beta blockers.  Further  workup as per Dr. Armanda Magic.           ______________________________  Cassell Clement, M.D.     TB/MEDQ  D:  03/28/2006  T:  03/28/2006  Job:  [id number]   cc:   Armanda Magic, M.D.  Anna Genre Little, M.D.  Kerin Perna, M.D.

## 2010-12-30 NOTE — Consult Note (Signed)
NAMENANDITA, MATHENIA NO.:  [id number]   MEDICAL RECORD NO.:  [id number]          PATIENT TYPE:  OIB   LOCATION:  2918                         FACILITY:  [facility]   PHYSICIAN:  Kerin Perna, M.D.  DATE OF BIRTH:  September 12, 1930   DATE OF CONSULTATION:  01/04/2006  DATE OF DISCHARGE:                                   CONSULTATION   PHYSICIAN REQUESTING CONSULTATION:  Armanda Magic, M.D.   PRIMARY CARE PHYSICIAN:  Catha Gosselin, M.D.   CONSULTANT:  Kerin Perna, M.D.   REASON FOR CONSULTATION:  Severe three-vessel coronary disease with unstable  angina.   CHIEF COMPLAINT:  Chest pain.   HISTORY OF PRESENT ILLNESS:  I was asked to evaluate this 77 year old white  female for potential surgical coronary revascularization for recently  diagnosed severe multivessel coronary artery disease.  The patient has no  documented prior history of coronary disease, cardiac arrhythmia, or cardiac  murmur.  She has had symptoms of unstable angina over the past few weeks.  This is described as exertional substernal pressing pain and a burning  sensation with heaviness in the chest that radiates down her right arm.  This is usually fairly quickly relieved with rest and cessation of activity.  She maintains a very active lifestyle including water aerobics, dancing and  travel, and she has noticed this chest pain during exertion in each of these  activities.  She was seen in Dr. Norris Cross office today and admitted to the  hospital for direct cardiac catheterization.  The results of the  catheterization demonstrated severe multivessel coronary artery disease with  90% stenosis of the LAD diagonal, 95% stenosis of the ramus intermediate, a  total occlusion of an OM-3 branch of the circumflex which filled via  collaterals, and an 80% stenosis of the distal posterolateral branch of the  circumflex.  The right coronary was codominant and not diseased.  Left  ventricular end-diastolic  pressure was 12 mmHg and her ejection fraction was  60%.  There is no evidence of aortic stenosis or mitral regurgitation on the  cardiac catheterization.  Her descending aorta and arch appeared to be  mildly dilated.  Because of her multivessel coronary disease and symptoms of  unstable angina, she was felt to be candidate for surgical revascularization  and she is being admitted to the hospital this evening to the CCU.   PAST MEDICAL HISTORY:  1.  Large hiatal hernia with distal esophageal ulceration.  2.  Dyslipidemia.  3.  No known drug allergies.  4.  Status post lumbar disk injection in the past; otherwise, no major      operations.   HOME MEDICATIONS:  1.  Synthroid 50 mcg daily.  2.  Zocor 40 mg daily.  3.  Nexium 40 mg b.i.d.  4.  Aspirin 81 mg a day.  5.  Reglan 10 mg q.h.s. and 10 mg a.c.  6.  Calcium one tablet daily.   SOCIAL HISTORY:  The patient is married with four children and is in good  overall health and is quite active.  She denies any tobacco  or alcohol use.   FAMILY HISTORY:  Her father died at age 43 of leukemia.  Her mother died at  age 23 from cardiac valve disease as a complication of rheumatic fever.   REVIEW OF SYSTEMS:  CONSTITUTIONAL REVIEW:  Negative for fever or weight  loss.  ENT REVIEW:  Negative for change in vision or active dental problems.  She has some difficulty swallowing but no symptoms of obstruction and the  main symptoms are heartburn and a pressure sensation with spicy food.  THORACIC REVIEW:  Negative for history of chest trauma, rib fracture or  pneumothorax.  There is no history of pulmonary nodule on chest x-ray or  recent upper respiratory infection.  CARDIAC REVIEW:  Negative for prior  coronary artery disease, diagnosis angina, myocardial infarction or cardiac  murmur.  GI REVIEW:  Positive for significant GERD and she also has  hoarseness thought to be related to her reflux and microaspiration.  She  denies any problems with  GI blood loss, hepatitis or jaundice.  UROLOGIC  REVIEW:  Negative for UTI or hematuria.  ENDOCRINE REVIEW:  Negative  diabetes.  Positive for thyroid disease.  HEMATOLOGIC REVIEW:  Negative for  bleeding disorder or blood transfusion.  NEUROLOGIC REVIEW:  Negative stroke  or seizure.  VASCULAR REVIEW:  Negative for DVT, varicose veins,  claudication, TIA.  MUSCULOSKELETAL REVIEW:  Negative for significant  arthritis or gout.   PHYSICAL EXAMINATION:  GENERAL:  The patient is 6 feet 6 inches and weighs  167 pounds, blood pressure 130/80, pulse 60 and regular, respirations 18,  saturation 95% on room air.  GENERAL APPEARANCE:  That of a 77 year old female in the catheterization  laboratory holding area following cardiac catheterization, surrounded by her  daughter and husband.  HEENT:  Exam is normocephalic.  Full EOMs. Dentition good.  Pharynx clear.  NECK:  Without JVD, mass or carotid bruit.  LYMPHATICS:  Reveal no palpable supraclavicular or cervical adenopathy.  THORACIC:  Reveals clear breath sounds bilaterally and there is no thoracic  deformity.  CARDIAC:  Reveals a regular rhythm without S3 gallop.  There is a soft 2/6  systolic ejection murmur at the right upper sternal border.  No friction rub  is present.  ABDOMEN:  Soft, nontender without organomegaly, pulsatile mass, or bruit.  EXTREMITIES:  Reveal no clubbing, cyanosis or edema.  VASCULAR:  Reveals 2+ pulses bilaterally at the radial and femoral areas,  nonpalpable pedal pulses.  She has no evidence of significant varicose  veins; however, she is limited to bedrest at this time following cardiac  catheterization.  SKIN:  Without rash or lesion.  NEUROLOGIC:  Alert and oriented.  There is no focal motor deficit.   LABORATORY DATA:  I have reviewed the coronary arteriograms with Dr. Mayford Knife  and this patient has severe multivessel coronary disease with a preserved  left ventricular systolic function.  IMPRESSION AND PLAN:   The patient will be admitted and scheduled for a 2-D  echocardiogram.  She will ultimately need multivessel coronary bypass  grafting and the date is being set in the next 24-48 hours.  The patient  will also probably need an imaging study of her thoracic aorta as it appears  to be somewhat dilated on the ventriculogram.  The plan for surgery was  discussed with the patient and her family and she understands and is in  agreement.  Thank you for the consultation.      Kerin Perna, M.D.  Electronically Signed  PV/MEDQ  D:  01/04/2006  T:  01/05/2006  Job:  [id number]   cc:   Dellis Anes. Idell Pickles, M.D.  Fax: [phone]   Anna Genre. Little, M.D.  Fax: [phone]

## 2010-12-30 NOTE — Discharge Summary (Signed)
Kimberly [name], Kimberly [name]               ACCOUNT NO.:  [id number]   MEDICAL RECORD NO.:  [id number]          PATIENT TYPE:  OBV   LOCATION:  6529                         FACILITY:  [facility]   PHYSICIAN:  Armanda Magic, M.D.     DATE OF BIRTH:  [birth year]   DATE OF ADMISSION:  03/28/2006  DATE OF DISCHARGE:  03/30/2006                                 DISCHARGE SUMMARY   DISCHARGE DIAGNOSES:  1. Cardiotomy syndrome.  2. Known coronary artery disease; history of coronary artery bypass      grafting on Jan 10, 2006, by Dr. Kathlee Nations Trigt.  3. Hiatal hernia.  4. Long-term medication use.   HISTORY:  [name] returns on March 28, 2006 with substernal chest pain  across the entire precordium.  She underwent coronary artery bypass grafting  on Jan 10, 2006, by Dr. Kathlee Nations Trigt.  Her cardiac enzymes were  essentially normal.  A 2D echo showed normal EF with no wall-motion  abnormality; the aortic valve thickness was mildly to moderately increased  and there was lower normal aortic leaflet excursion; bilateral atrial  enlargement.  D-dimer was mildly elevated, and CT angiogram of the chest  showed no pulmonary embolism.   Ultimately the patient was placed on ibuprofen and was discharged home on  March 30, 2006.   LABORATORY STUDIES:  Include a sodium of 138, potassium 4.1, BUN 11,  creatinine 0.7.  Magnesium 2.1.  D-dimer 0.7.  PT 13.2, INR 1.  Hemoglobin  13.5, hematocrit 40.3, platelets 239, white count 9.7.  Cardiac isoenzymes  were negative with the exception of a mildly elevated troponin of 0.06.  Had  a cholesterol of 152, triglycerides 43, HDL 56, LDL 87.  Sed rate 29.   The patient was discharged to home on the following medications.  1. Ibuprofen 400 mg with meals daily.  2. Synthroid 50 mcg daily.  3. Nexium 40 mg a day.  4. Zocor 40 mg daily.   She is on a renal, low-fat diet.  She may stop ibuprofen if she has GI  upset.  Follow up with Dr. Mayford Knife on May 07, 2006, at  1:15 p.m. as  already scheduled.     Guy Franco, P.A.      Armanda Magic, M.D.  Electronically Signed   LB/MEDQ  D:  03/30/2006  T:  03/30/2006  Job:  [id number]

## 2011-10-11 ENCOUNTER — Other Ambulatory Visit: Payer: Self-pay | Admitting: Family Medicine

## 2011-10-11 DIAGNOSIS — M545 Low back pain, unspecified: Secondary | ICD-10-CM

## 2011-10-12 ENCOUNTER — Ambulatory Visit
Admission: RE | Admit: 2011-10-12 | Discharge: 2011-10-12 | Disposition: A | Discharge status: Home / Self Care | Payer: Medicare Other | Source: Ambulatory Visit | Attending: Family Medicine | Admitting: Family Medicine

## 2011-10-12 DIAGNOSIS — M545 Low back pain, unspecified: Secondary | ICD-10-CM

## 2011-10-17 ENCOUNTER — Other Ambulatory Visit: Payer: Self-pay

## 2011-10-19 ENCOUNTER — Encounter (HOSPITAL_COMMUNITY): Payer: Self-pay | Admitting: Pharmacy Technician

## 2011-10-19 NOTE — Progress Notes (Signed)
Nurse called Dr.  Lindalou Hose office and spoke to [place]. Erie Noe was informed that orders were needed for patient.

## 2011-10-20 ENCOUNTER — Encounter (HOSPITAL_COMMUNITY): Payer: Self-pay

## 2011-10-20 ENCOUNTER — Encounter (HOSPITAL_COMMUNITY)
Admission: RE | Admit: 2011-10-20 | Discharge: 2011-10-20 | Disposition: A | Discharge status: Home / Self Care | Payer: Medicare Other | Source: Ambulatory Visit | Attending: Anesthesiology | Admitting: Anesthesiology

## 2011-10-20 ENCOUNTER — Encounter (HOSPITAL_COMMUNITY)
Admission: RE | Admit: 2011-10-20 | Discharge: 2011-10-20 | Disposition: A | Discharge status: Home / Self Care | Payer: Medicare Other | Source: Ambulatory Visit | Attending: Neurosurgery | Admitting: Neurosurgery

## 2011-10-20 HISTORY — DX: Gastro-esophageal reflux disease without esophagitis: K21.9

## 2011-10-20 HISTORY — DX: Myoneural disorder, unspecified: G70.9

## 2011-10-20 HISTORY — DX: Hypothyroidism, unspecified: E03.9

## 2011-10-20 HISTORY — DX: Adverse effect of unspecified anesthetic, initial encounter: T41.45XA

## 2011-10-20 HISTORY — DX: Personal history of other diseases of the digestive system: Z87.19

## 2011-10-20 HISTORY — DX: Unspecified osteoarthritis, unspecified site: M19.90

## 2011-10-20 HISTORY — DX: Atherosclerotic heart disease of native coronary artery without angina pectoris: I25.10

## 2011-10-20 HISTORY — DX: Other complications of anesthesia, initial encounter: T88.59XA

## 2011-10-20 LAB — DIFFERENTIAL
Basophils Absolute: 0 10*3/uL (ref 0.0–0.1)
Basophils Relative: 1 % (ref 0–1)
Eosinophils Absolute: 0.2 10*3/uL (ref 0.0–0.7)
Eosinophils Relative: 3 % (ref 0–5)
Lymphocytes Relative: 27 % (ref 12–46)
Lymphs Abs: 1.8 10*3/uL (ref 0.7–4.0)
Monocytes Absolute: 0.6 10*3/uL (ref 0.1–1.0)
Monocytes Relative: 9 % (ref 3–12)
Neutro Abs: 3.9 10*3/uL (ref 1.7–7.7)
Neutrophils Relative %: 60 % (ref 43–77)

## 2011-10-20 LAB — CBC
HCT: 43.6 % (ref 36.0–46.0)
Hemoglobin: 14.4 g/dL (ref 12.0–15.0)
MCH: 28.6 pg (ref 26.0–34.0)
MCHC: 33 g/dL (ref 30.0–36.0)
MCV: 86.5 fL (ref 78.0–100.0)
Platelets: 190 10*3/uL (ref 150–400)
RBC: 5.04 MIL/uL (ref 3.87–5.11)
RDW: 13.4 % (ref 11.5–15.5)
WBC: 6.5 10*3/uL (ref 4.0–10.5)

## 2011-10-20 LAB — BASIC METABOLIC PANEL
BUN: 16 mg/dL (ref 6–23)
CO2: 30 mEq/L (ref 19–32)
Calcium: 10.6 mg/dL — ABNORMAL HIGH (ref 8.4–10.5)
Chloride: 107 mEq/L (ref 96–112)
Creatinine, Ser: 0.9 mg/dL (ref 0.50–1.10)
GFR calc Af Amer: 69 mL/min — ABNORMAL LOW (ref >90)
GFR calc non Af Amer: 60 mL/min — ABNORMAL LOW (ref >90)
Glucose, Bld: 78 mg/dL (ref 70–99)
Potassium: 3.9 mEq/L (ref 3.5–5.1)
Sodium: 144 mEq/L (ref 135–145)

## 2011-10-20 LAB — TYPE AND SCREEN
ABO/RH(D): A POS
Antibody Screen: NEGATIVE

## 2011-10-20 LAB — SURGICAL PCR SCREEN
MRSA, PCR: NEGATIVE
Staphylococcus aureus: NEGATIVE

## 2011-10-20 MED ORDER — VANCOMYCIN HCL IN DEXTROSE 1-5 GM/200ML-% IV SOLN: 1000.0000 mg | INTRAVENOUS | Status: DC

## 2011-10-20 MED ORDER — DEXAMETHASONE SODIUM PHOSPHATE 10 MG/ML IJ SOLN: 10.0000 mg | Freq: Once | INTRAMUSCULAR | Status: DC

## 2011-10-20 NOTE — Progress Notes (Signed)
Kimberly [name] ..requesting orders .Marland KitchenMarland Kitchen

## 2011-10-20 NOTE — Pre-Procedure Instructions (Signed)
Kimberly [name]  10/20/2011   Your procedure is scheduled on:  10/23/2011  Report to [facility] at 1130 AM.  Call this number if you have problems the morning of surgery: [phone]   Remember:   Do not eat food:After Midnight.  May have clear liquids: up to 4 Hours before arrival .12/10/1930 NOT DRINK AFTER.0730 AM  Clear liquids include soda, tea, black coffee, apple or grape juice, broth.  Take these medicines the morning of surgery with A SIP OF WATER: NEXIUM  SYNTHROID   Do not wear jewelry, make-up or nail polish.  Do not wear lotions, powders, or perfumes. You may wear deodorant.  Do not shave 48 hours prior to surgery.  Do not bring valuables to the hospital.  Contacts, dentures or bridgework may not be worn into surgery.  Leave suitcase in the car. After surgery it may be brought to your room.  For patients admitted to the hospital, checkout time is 11:00 AM the day of discharge.   Patients discharged the day of surgery will not be allowed to drive home.  Name and phone number of your driver:   Special Instructions: CHG Shower Use Special Wash: 1/2 bottle night before surgery and 1/2 bottle morning of surgery.   Please read over the following fact sheets that you were given: Pain Booklet, Coughing and Deep Breathing, Lab Information, MRSA Information and Surgical Site Infection Prevention

## 2011-10-20 NOTE — Progress Notes (Signed)
CALLED AND REQ EKG (LEFT MESSAGE ) ON VOICE MAIL .Marland KitchenMarland KitchenREQUESTED EKG FOR Monday SURGERY.

## 2011-10-23 ENCOUNTER — Encounter (HOSPITAL_COMMUNITY): Payer: Self-pay | Admitting: Neurosurgery

## 2011-10-23 ENCOUNTER — Encounter (HOSPITAL_COMMUNITY)
Admission: RE | Disposition: A | Discharge status: Home / Self Care | Payer: Self-pay | Source: Ambulatory Visit | Attending: Neurosurgery

## 2011-10-23 ENCOUNTER — Encounter (HOSPITAL_COMMUNITY): Payer: Self-pay | Admitting: Anesthesiology

## 2011-10-23 ENCOUNTER — Ambulatory Visit (HOSPITAL_COMMUNITY): Payer: Medicare Other | Admitting: Anesthesiology

## 2011-10-23 ENCOUNTER — Ambulatory Visit (HOSPITAL_COMMUNITY): Payer: Medicare Other

## 2011-10-23 ENCOUNTER — Other Ambulatory Visit: Payer: Self-pay

## 2011-10-23 ENCOUNTER — Ambulatory Visit (HOSPITAL_COMMUNITY)
Admission: RE | Admit: 2011-10-23 | Discharge: 2011-10-24 | Disposition: A | Discharge status: Home / Self Care | Payer: Medicare Other | Source: Ambulatory Visit | Attending: Neurosurgery | Admitting: Neurosurgery

## 2011-10-23 ENCOUNTER — Encounter (HOSPITAL_COMMUNITY): Payer: Self-pay | Admitting: Surgery

## 2011-10-23 DIAGNOSIS — M129 Arthropathy, unspecified: Secondary | ICD-10-CM | POA: Insufficient documentation

## 2011-10-23 DIAGNOSIS — Z01812 Encounter for preprocedural laboratory examination: Secondary | ICD-10-CM | POA: Insufficient documentation

## 2011-10-23 DIAGNOSIS — M5116 Intervertebral disc disorders with radiculopathy, lumbar region: Secondary | ICD-10-CM | POA: Diagnosis present

## 2011-10-23 DIAGNOSIS — M5126 Other intervertebral disc displacement, lumbar region: Secondary | ICD-10-CM | POA: Insufficient documentation

## 2011-10-23 DIAGNOSIS — I2510 Atherosclerotic heart disease of native coronary artery without angina pectoris: Secondary | ICD-10-CM | POA: Insufficient documentation

## 2011-10-23 DIAGNOSIS — K449 Diaphragmatic hernia without obstruction or gangrene: Secondary | ICD-10-CM | POA: Insufficient documentation

## 2011-10-23 DIAGNOSIS — K219 Gastro-esophageal reflux disease without esophagitis: Secondary | ICD-10-CM | POA: Insufficient documentation

## 2011-10-23 DIAGNOSIS — E039 Hypothyroidism, unspecified: Secondary | ICD-10-CM | POA: Insufficient documentation

## 2011-10-23 DIAGNOSIS — Z01818 Encounter for other preprocedural examination: Secondary | ICD-10-CM | POA: Insufficient documentation

## 2011-10-23 DIAGNOSIS — Z951 Presence of aortocoronary bypass graft: Secondary | ICD-10-CM | POA: Insufficient documentation

## 2011-10-23 HISTORY — PX: LUMBAR LAMINECTOMY/DECOMPRESSION MICRODISCECTOMY: SHX5026

## 2011-10-23 HISTORY — DX: Dizziness and giddiness: R42

## 2011-10-23 HISTORY — PX: LAMINECTOMY: SHX219

## 2011-10-23 SURGERY — LUMBAR LAMINECTOMY/DECOMPRESSION MICRODISCECTOMY 1 LEVEL
Anesthesia: General | Site: Back | Laterality: Left | Wound class: Clean

## 2011-10-23 MED ORDER — VANCOMYCIN HCL IN DEXTROSE 1-5 GM/200ML-% IV SOLN
1000.0000 mg | Freq: Once | INTRAVENOUS | Status: AC
  Administered 2011-10-23: 1000 mg via INTRAVENOUS
  Filled 2011-10-23: qty 200

## 2011-10-23 MED ORDER — ZOLPIDEM TARTRATE 5 MG PO TABS: 5.0000 mg | ORAL_TABLET | Freq: Every evening | ORAL | Status: DC | PRN

## 2011-10-23 MED ORDER — CYCLOBENZAPRINE HCL 10 MG PO TABS: 10.0000 mg | ORAL_TABLET | Freq: Three times a day (TID) | ORAL | Status: DC | PRN

## 2011-10-23 MED ORDER — ACETAMINOPHEN 650 MG RE SUPP: 650.0000 mg | RECTAL | Status: DC | PRN

## 2011-10-23 MED ORDER — KETOROLAC TROMETHAMINE 30 MG/ML IJ SOLN
15.0000 mg | Freq: Four times a day (QID) | INTRAMUSCULAR | Status: DC
  Administered 2011-10-23: 18:00:00 via INTRAVENOUS
  Administered 2011-10-24 (×2): 15 mg via INTRAVENOUS
  Filled 2011-10-23 (×7): qty 1

## 2011-10-23 MED ORDER — HEMOSTATIC AGENTS (NO CHARGE) OPTIME
TOPICAL | Status: DC | PRN
  Administered 2011-10-23: 1 via TOPICAL

## 2011-10-23 MED ORDER — NEOSTIGMINE METHYLSULFATE 1 MG/ML IJ SOLN
INTRAMUSCULAR | Status: DC | PRN
  Administered 2011-10-23: 4 mg via INTRAVENOUS

## 2011-10-23 MED ORDER — ASPIRIN EC 81 MG PO TBEC
81.0000 mg | DELAYED_RELEASE_TABLET | Freq: Every day | ORAL | Status: DC
  Filled 2011-10-23: qty 1

## 2011-10-23 MED ORDER — PANTOPRAZOLE SODIUM 40 MG PO TBEC: 80.0000 mg | DELAYED_RELEASE_TABLET | Freq: Every day | ORAL | Status: DC

## 2011-10-23 MED ORDER — ONDANSETRON HCL 4 MG/2ML IJ SOLN
INTRAMUSCULAR | Status: DC | PRN
  Administered 2011-10-23: 4 mg via INTRAVENOUS

## 2011-10-23 MED ORDER — ONDANSETRON HCL 4 MG/2ML IJ SOLN: 4.0000 mg | Freq: Once | INTRAMUSCULAR | Status: DC | PRN

## 2011-10-23 MED ORDER — EPHEDRINE SULFATE 50 MG/ML IJ SOLN
INTRAMUSCULAR | Status: DC | PRN
  Administered 2011-10-23: 10 mg via INTRAVENOUS

## 2011-10-23 MED ORDER — BISACODYL 10 MG RE SUPP: 10.0000 mg | Freq: Every day | RECTAL | Status: DC | PRN

## 2011-10-23 MED ORDER — MENTHOL 3 MG MT LOZG: 1.0000 | LOZENGE | OROMUCOSAL | Status: DC | PRN

## 2011-10-23 MED ORDER — SODIUM CHLORIDE 0.9 % IJ SOLN: 3.0000 mL | INTRAMUSCULAR | Status: DC | PRN

## 2011-10-23 MED ORDER — LEVOTHYROXINE SODIUM 50 MCG PO TABS
50.0000 ug | ORAL_TABLET | Freq: Every day | ORAL | Status: DC
  Administered 2011-10-24: 50 ug via ORAL
  Filled 2011-10-23 (×2): qty 1

## 2011-10-23 MED ORDER — HYDROMORPHONE HCL PF 1 MG/ML IJ SOLN
INTRAMUSCULAR | Status: AC
  Filled 2011-10-23: qty 1

## 2011-10-23 MED ORDER — HYDROMORPHONE HCL PF 1 MG/ML IJ SOLN
0.5000 mg | INTRAMUSCULAR | Status: DC | PRN
  Administered 2011-10-23: 0.5 mg via INTRAVENOUS

## 2011-10-23 MED ORDER — HYDROCODONE-ACETAMINOPHEN 5-325 MG PO TABS: 1.0000 | ORAL_TABLET | ORAL | Status: DC | PRN

## 2011-10-23 MED ORDER — SODIUM CHLORIDE 0.9 % IV SOLN
250.0000 mL | INTRAVENOUS | Status: DC
  Administered 2011-10-23: 250 mL via INTRAVENOUS

## 2011-10-23 MED ORDER — GLYCOPYRROLATE 0.2 MG/ML IJ SOLN
INTRAMUSCULAR | Status: DC | PRN
  Administered 2011-10-23: .8 mg via INTRAVENOUS

## 2011-10-23 MED ORDER — HYDROMORPHONE HCL PF 1 MG/ML IJ SOLN
0.2500 mg | INTRAMUSCULAR | Status: DC | PRN
  Administered 2011-10-23: 0.25 mg via INTRAVENOUS

## 2011-10-23 MED ORDER — THROMBIN 5000 UNITS EX KIT
PACK | CUTANEOUS | Status: DC | PRN
  Administered 2011-10-23 (×2): 5000 [IU] via TOPICAL

## 2011-10-23 MED ORDER — SODIUM CHLORIDE 0.9 % IJ SOLN
3.0000 mL | Freq: Two times a day (BID) | INTRAMUSCULAR | Status: DC
  Administered 2011-10-23 (×2): 3 mL via INTRAVENOUS

## 2011-10-23 MED ORDER — VANCOMYCIN HCL 500 MG IV SOLR
500.0000 mg | INTRAVENOUS | Status: DC
  Administered 2011-10-23: 500 mg
  Filled 2011-10-23: qty 500

## 2011-10-23 MED ORDER — POLYETHYLENE GLYCOL 3350 17 G PO PACK
17.0000 g | PACK | Freq: Every day | ORAL | Status: DC | PRN
  Filled 2011-10-23: qty 1

## 2011-10-23 MED ORDER — ROCURONIUM BROMIDE 100 MG/10ML IV SOLN
INTRAVENOUS | Status: DC | PRN
  Administered 2011-10-23: 10 mg via INTRAVENOUS
  Administered 2011-10-23: 30 mg via INTRAVENOUS

## 2011-10-23 MED ORDER — VANCOMYCIN HCL 500 MG IV SOLR
500.0000 mg | Freq: Once | INTRAVENOUS | Status: AC
  Administered 2011-10-23 (×2): 500 mg via INTRAVENOUS
  Filled 2011-10-23: qty 500

## 2011-10-23 MED ORDER — SODIUM CHLORIDE 0.9 % IR SOLN
Status: DC | PRN
  Administered 2011-10-23: 13:00:00

## 2011-10-23 MED ORDER — PROPOFOL 10 MG/ML IV EMUL
INTRAVENOUS | Status: DC | PRN
  Administered 2011-10-23: 130 mg via INTRAVENOUS

## 2011-10-23 MED ORDER — ALUM & MAG HYDROXIDE-SIMETH 200-200-20 MG/5ML PO SUSP: 30.0000 mL | Freq: Four times a day (QID) | ORAL | Status: DC | PRN

## 2011-10-23 MED ORDER — LACTATED RINGERS IV SOLN
INTRAVENOUS | Status: DC | PRN
  Administered 2011-10-23 (×2): via INTRAVENOUS

## 2011-10-23 MED ORDER — BUPIVACAINE HCL (PF) 0.25 % IJ SOLN
INTRAMUSCULAR | Status: DC | PRN
  Administered 2011-10-23: 20 mL

## 2011-10-23 MED ORDER — ONDANSETRON HCL 4 MG/2ML IJ SOLN: 4.0000 mg | INTRAMUSCULAR | Status: DC | PRN

## 2011-10-23 MED ORDER — OXYCODONE-ACETAMINOPHEN 5-325 MG PO TABS
1.0000 | ORAL_TABLET | ORAL | Status: DC | PRN
  Administered 2011-10-23 (×2): 2 via ORAL
  Filled 2011-10-23 (×3): qty 2

## 2011-10-23 MED ORDER — DEXAMETHASONE SODIUM PHOSPHATE 10 MG/ML IJ SOLN
10.0000 mg | Freq: Once | INTRAMUSCULAR | Status: AC
  Administered 2011-10-23 (×2): 10 mg via INTRAVENOUS
  Filled 2011-10-23: qty 1

## 2011-10-23 MED ORDER — 0.9 % SODIUM CHLORIDE (POUR BTL) OPTIME
TOPICAL | Status: DC | PRN
  Administered 2011-10-23: 1000 mL

## 2011-10-23 MED ORDER — FENTANYL CITRATE 0.05 MG/ML IJ SOLN
INTRAMUSCULAR | Status: DC | PRN
  Administered 2011-10-23: 50 ug via INTRAVENOUS
  Administered 2011-10-23: 100 ug via INTRAVENOUS

## 2011-10-23 MED ORDER — OMEGA-3-ACID ETHYL ESTERS 1 G PO CAPS
1.0000 g | ORAL_CAPSULE | Freq: Every day | ORAL | Status: DC
  Filled 2011-10-23: qty 1

## 2011-10-23 MED ORDER — HYDROMORPHONE HCL PF 1 MG/ML IJ SOLN
INTRAMUSCULAR | Status: AC
  Administered 2011-10-23: 13:00:00
  Filled 2011-10-23: qty 1

## 2011-10-23 MED ORDER — SENNA 8.6 MG PO TABS
1.0000 | ORAL_TABLET | Freq: Two times a day (BID) | ORAL | Status: DC
  Administered 2011-10-24: 8.6 mg via ORAL
  Filled 2011-10-23 (×3): qty 1

## 2011-10-23 MED ORDER — ATORVASTATIN CALCIUM 40 MG PO TABS
40.0000 mg | ORAL_TABLET | Freq: Every day | ORAL | Status: DC
  Administered 2011-10-23: 40 mg via ORAL
  Filled 2011-10-23 (×2): qty 1

## 2011-10-23 MED ORDER — PHENOL 1.4 % MT LIQD: 1.0000 | OROMUCOSAL | Status: DC | PRN

## 2011-10-23 MED ORDER — ADULT MULTIVITAMIN W/MINERALS CH
1.0000 | ORAL_TABLET | Freq: Every day | ORAL | Status: DC
  Filled 2011-10-23: qty 1

## 2011-10-23 MED ORDER — ACETAMINOPHEN 325 MG PO TABS: 650.0000 mg | ORAL_TABLET | ORAL | Status: DC | PRN

## 2011-10-23 MED ORDER — LIDOCAINE HCL (CARDIAC) 20 MG/ML IV SOLN
INTRAVENOUS | Status: DC | PRN
  Administered 2011-10-23: 80 mg via INTRAVENOUS

## 2011-10-23 MED ORDER — FLEET ENEMA 7-19 GM/118ML RE ENEM: 1.0000 | ENEMA | Freq: Once | RECTAL | Status: AC | PRN

## 2011-10-23 MED ORDER — KETOROLAC TROMETHAMINE 15 MG/ML IJ SOLN
INTRAMUSCULAR | Status: DC | PRN
  Administered 2011-10-23: 15 mg via INTRAVENOUS

## 2011-10-23 MED ORDER — OMEGA-3 FATTY ACIDS 1000 MG PO CAPS: 1.0000 g | ORAL_CAPSULE | ORAL | Status: DC

## 2011-10-23 SURGICAL SUPPLY — 60 items
ADH SKN CLS APL DERMABOND .7 (GAUZE/BANDAGES/DRESSINGS) ×1
ADH SKN CLS LQ APL DERMABOND (GAUZE/BANDAGES/DRESSINGS) ×1
APL SKNCLS STERI-STRIP NONHPOA (GAUZE/BANDAGES/DRESSINGS) ×1
BAG DECANTER FOR FLEXI CONT (MISCELLANEOUS) ×2 IMPLANT
BENZOIN TINCTURE PRP APPL 2/3 (GAUZE/BANDAGES/DRESSINGS) ×2 IMPLANT
BLADE SURG ROTATE 9660 (MISCELLANEOUS) IMPLANT
BRUSH SCRUB EZ PLAIN DRY (MISCELLANEOUS) ×2 IMPLANT
BUR CUTTER 7.0 ROUND (BURR) ×2 IMPLANT
CANISTER SUCTION 2500CC (MISCELLANEOUS) ×2 IMPLANT
CLOSURE STERI STRIP 1/2 X4 (GAUZE/BANDAGES/DRESSINGS) ×1 IMPLANT
CLOTH BEACON ORANGE TIMEOUT ST (SAFETY) ×2 IMPLANT
CONT SPEC 4OZ CLIKSEAL STRL BL (MISCELLANEOUS) ×2 IMPLANT
DECANTER SPIKE VIAL GLASS SM (MISCELLANEOUS) ×2 IMPLANT
DERMABOND ADHESIVE PROPEN (GAUZE/BANDAGES/DRESSINGS) ×1
DERMABOND ADVANCED (GAUZE/BANDAGES/DRESSINGS) ×1
DERMABOND ADVANCED .7 DNX12 (GAUZE/BANDAGES/DRESSINGS) ×1 IMPLANT
DERMABOND ADVANCED .7 DNX6 (GAUZE/BANDAGES/DRESSINGS) IMPLANT
DRAPE LAPAROTOMY 100X72X124 (DRAPES) ×2 IMPLANT
DRAPE POUCH INSTRU U-SHP 10X18 (DRAPES) ×2 IMPLANT
DRAPE PROXIMA HALF (DRAPES) IMPLANT
DRAPE SURG 17X23 STRL (DRAPES) ×4 IMPLANT
ELECT REM PT RETURN 9FT ADLT (ELECTROSURGICAL) ×2
ELECTRODE REM PT RTRN 9FT ADLT (ELECTROSURGICAL) ×1 IMPLANT
GAUZE SPONGE 4X4 12PLY STRL LF (GAUZE/BANDAGES/DRESSINGS) ×1 IMPLANT
GAUZE SPONGE 4X4 16PLY XRAY LF (GAUZE/BANDAGES/DRESSINGS) IMPLANT
GLOVE BIO SURGEON STRL SZ8 (GLOVE) ×1 IMPLANT
GLOVE BIOGEL PI IND STRL 8 (GLOVE) IMPLANT
GLOVE BIOGEL PI IND STRL 8.5 (GLOVE) IMPLANT
GLOVE BIOGEL PI INDICATOR 8 (GLOVE) ×1
GLOVE BIOGEL PI INDICATOR 8.5 (GLOVE) ×1
GLOVE ECLIPSE 7.5 STRL STRAW (GLOVE) ×1 IMPLANT
GLOVE ECLIPSE 8.5 STRL (GLOVE) ×2 IMPLANT
GLOVE EXAM NITRILE LRG STRL (GLOVE) IMPLANT
GLOVE EXAM NITRILE MD LF STRL (GLOVE) ×1 IMPLANT
GLOVE EXAM NITRILE XL STR (GLOVE) IMPLANT
GLOVE EXAM NITRILE XS STR PU (GLOVE) IMPLANT
GLOVE INDICATOR 8.5 STRL (GLOVE) ×1 IMPLANT
GLOVE SURG SS PI 8.0 STRL IVOR (GLOVE) ×3 IMPLANT
GOWN BRE IMP SLV AUR LG STRL (GOWN DISPOSABLE) IMPLANT
GOWN BRE IMP SLV AUR XL STRL (GOWN DISPOSABLE) ×3 IMPLANT
GOWN STRL REIN 2XL LVL4 (GOWN DISPOSABLE) ×2 IMPLANT
KIT BASIN OR (CUSTOM PROCEDURE TRAY) ×2 IMPLANT
KIT ROOM TURNOVER OR (KITS) ×2 IMPLANT
NDL SPNL 22GX3.5 QUINCKE BK (NEEDLE) ×1 IMPLANT
NEEDLE HYPO 22GX1.5 SAFETY (NEEDLE) ×2 IMPLANT
NEEDLE SPNL 22GX3.5 QUINCKE BK (NEEDLE) ×2 IMPLANT
NS IRRIG 1000ML POUR BTL (IV SOLUTION) ×2 IMPLANT
PACK LAMINECTOMY NEURO (CUSTOM PROCEDURE TRAY) ×2 IMPLANT
PAD ARMBOARD 7.5X6 YLW CONV (MISCELLANEOUS) ×6 IMPLANT
RUBBERBAND STERILE (MISCELLANEOUS) ×4 IMPLANT
SPONGE GAUZE 4X4 12PLY (GAUZE/BANDAGES/DRESSINGS) ×2 IMPLANT
SPONGE SURGIFOAM ABS GEL SZ50 (HEMOSTASIS) ×2 IMPLANT
STRIP CLOSURE SKIN 1/2X4 (GAUZE/BANDAGES/DRESSINGS) ×2 IMPLANT
SUT VIC AB 2-0 CT1 18 (SUTURE) ×2 IMPLANT
SUT VIC AB 3-0 SH 8-18 (SUTURE) ×2 IMPLANT
SYR 20ML ECCENTRIC (SYRINGE) ×2 IMPLANT
TAPE CLOTH SURG 4X10 WHT LF (GAUZE/BANDAGES/DRESSINGS) ×1 IMPLANT
TOWEL OR 17X24 6PK STRL BLUE (TOWEL DISPOSABLE) ×2 IMPLANT
TOWEL OR 17X26 10 PK STRL BLUE (TOWEL DISPOSABLE) ×2 IMPLANT
WATER STERILE IRR 1000ML POUR (IV SOLUTION) ×2 IMPLANT

## 2011-10-23 NOTE — Anesthesia Preprocedure Evaluation (Addendum)
Anesthesia Evaluation  Patient identified by MRN, date of birth, ID band Patient awake    Reviewed: Allergy & Precautions, H&P , NPO status , Patient's Chart, lab work & pertinent test results, reviewed documented beta blocker date and time   History of Anesthesia Complications (+) AWARENESS UNDER ANESTHESIA  Airway Mallampati: I TM Distance: >3 FB Neck ROM: full    Dental  (+) Teeth Intact and Dental Advisory Given   Pulmonary          Cardiovascular + CAD and + CABG Rhythm:regular Rate:Normal     Neuro/Psych  Neuromuscular disease    GI/Hepatic hiatal hernia, GERD-  ,  Endo/Other  Hypothyroidism   Renal/GU      Musculoskeletal   Abdominal   Peds  Hematology   Anesthesia Other Findings   Reproductive/Obstetrics                          Anesthesia Physical Anesthesia Plan  ASA: III  Anesthesia Plan: General ETT   Post-op Pain Management:    Induction: Intravenous  Airway Management Planned: Oral ETT  Additional Equipment:   Intra-op Plan:   Post-operative Plan: Extubation in OR  Informed Consent: I have reviewed the patients History and Physical, chart, labs and discussed the procedure including the risks, benefits and alternatives for the proposed anesthesia with the patient or authorized representative who has indicated his/her understanding and acceptance.   Dental advisory given  Plan Discussed with: Anesthesiologist, CRNA and Surgeon  Anesthesia Plan Comments:        Anesthesia Quick Evaluation

## 2011-10-23 NOTE — Anesthesia Procedure Notes (Signed)
Procedure Name: Intubation Date/Time: 10/23/2011 11:26 AM Performed by: Mancel Parsons Pre-anesthesia Checklist: Patient identified, Timeout performed, Emergency Drugs available, Suction available and Patient being monitored Patient Re-evaluated:Patient Re-evaluated prior to inductionOxygen Delivery Method: Circle system utilized Preoxygenation: Pre-oxygenation with 100% oxygen Intubation Type: IV induction and Cricoid Pressure applied Ventilation: Mask ventilation without difficulty Laryngoscope Size: Mac and 3 Grade View: Grade III Tube type: Oral Tube size: 7.5 mm Number of attempts: 1 Airway Equipment and Method: Stylet and LTA kit utilized Placement Confirmation: ETT inserted through vocal cords under direct vision,  breath sounds checked- equal and bilateral and positive ETCO2 Secured at: 22 cm Tube secured with: Tape Dental Injury: Teeth and Oropharynx as per pre-operative assessment

## 2011-10-23 NOTE — Anesthesia Postprocedure Evaluation (Signed)
  Anesthesia Post-op Note  Patient: Kimberly [name]  Procedure(s) Performed: Procedure(s) (LRB): LUMBAR LAMINECTOMY/DECOMPRESSION MICRODISCECTOMY 1 LEVEL (Left)  Patient Location: PACU  Anesthesia Type: General  Level of Consciousness: awake, alert , oriented and patient cooperative  Airway and Oxygen Therapy: Patient Spontanous Breathing  Post-op Pain: mild  Post-op Assessment: Post-op Vital signs reviewed, Patient's Cardiovascular Status Stable, Respiratory Function Stable, Patent Airway, No signs of Nausea or vomiting and Pain level controlled  Post-op Vital Signs: stable  Complications: No apparent anesthesia complications

## 2011-10-23 NOTE — Progress Notes (Signed)
ANTIBIOTIC CONSULT NOTE - INITIAL  Pharmacy Consult for Vancomycin Indication: Post op surgical prophylaxis  Allergies  Allergen Reactions  . Amoxicillin Other (See Comments)    Reaction unknown per pt    Patient Measurements:  Weight: 71.2kg  Vital Signs: Temp: 97.3 F (36.3 C) (03/11 1400) Temp src: Oral (03/11 0918) BP: 151/55 mmHg (03/11 1400) Pulse Rate: 48  (03/11 1400) Intake/Output from previous day:   Intake/Output from this shift: Total I/O In: 1450 [I.V.:1450] Out: 10 [Blood:10]  Labs: No results found for this basename: WBC:3,HGB:3,PLT:3,LABCREA:3,CREATININE:3 in the last 72 hours CrCl is unknown because there is no height on file for the current visit. No results found for this basename: VANCOTROUGH:2,VANCOPEAK:2,VANCORANDOM:2,GENTTROUGH:2,GENTPEAK:2,GENTRANDOM:2,TOBRATROUGH:2,TOBRAPEAK:2,TOBRARND:2,AMIKACINPEAK:2,AMIKACINTROU:2,AMIKACIN:2, in the last 72 hours   Microbiology: Recent Results (from the past 720 hour(s))  SURGICAL PCR SCREEN     Status: Normal   Collection Time   10/20/11 10:15 AM      Component Value Range Status Comment   MRSA, PCR NEGATIVE  NEGATIVE  Final    Staphylococcus aureus NEGATIVE  NEGATIVE  Final     Medical History: Past Medical History  Diagnosis Date  . Complication of anesthesia     SLOW TO WAKE UP  . Coronary artery disease   . Hypothyroidism   . GERD (gastroesophageal reflux disease)   . H/O hiatal hernia   . Neuromuscular disorder   . Arthritis     Medications:  Prescriptions prior to admission  Medication Sig Dispense Refill  . Calcium Carbonate-Vitamin D (CALCIUM + D PO) Take 1 tablet by mouth 2 (two) times daily.      Marland Kitchen esomeprazole (NEXIUM) 40 MG capsule Take 40 mg by mouth 2 (two) times daily.      Marland Kitchen levothyroxine (SYNTHROID, LEVOTHROID) 50 MCG tablet Take 50 mcg by mouth every morning. TAKE 1 HOUR BEFORE BREAKFAST      . Multiple Vitamin (MULITIVITAMIN WITH MINERALS) TABS Take 1 tablet by mouth every  morning.      . simvastatin (ZOCOR) 80 MG tablet Take 80 mg by mouth at bedtime.      Marland Kitchen aspirin EC 81 MG tablet Take 81 mg by mouth every morning. HOLD FOR PROCEDURE      . fish oil-omega-3 fatty acids 1000 MG capsule Take 1 g by mouth every morning.       Assessment: Kimberly [name] s/p spinal surgery to receive Vancomycin x 1 dose for post-op surgical prophylaxis. Patient received Vancomycin 500mg  pre-op (~ 1130). No drains placed per Op-note.  - Wt: 71.2kg - CrCl: ~60 ml/min  Goal of Therapy:  Vancomycin trough level 10-15 mcg/ml  Plan:  1. Vancomycin 1g IV x 1 dose @ 2400 tonight 2. Pharmacy will sign-off. Please reconsult if additional assistance is needed.  Cleon Dew [phone] 10/23/2011,3:36 PM

## 2011-10-23 NOTE — Brief Op Note (Signed)
10/23/2011  12:29 PM  PATIENT:  Kimberly [name]  78 y.o. female  PRE-OPERATIVE DIAGNOSIS:  Herniated Nucleus Pulposus  POST-OPERATIVE DIAGNOSIS:  Herniated Nucleus Pulposus  PROCEDURE:  Procedure(s) (LRB): LUMBAR LAMINECTOMY/DECOMPRESSION MICRODISCECTOMY 1 LEVEL (Left)  SURGEON:  Surgeon(s) and Role:    * Temple Pacini, MD - Primary    * Mariam Dollar, MD - Assisting  PHYSICIAN ASSISTANT:   ASSISTANTS: Cram   ANESTHESIA:   general  EBL:  Total I/O In: 1000 [I.V.:1000] Out: 10 [Blood:10]  BLOOD ADMINISTERED:none  DRAINS: none   LOCAL MEDICATIONS USED:  MARCAINE     SPECIMEN:  No Specimen  DISPOSITION OF SPECIMEN:  N/A  COUNTS:  YES  TOURNIQUET:  * No tourniquets in log *  DICTATION: .Dragon Dictation  PLAN OF CARE: Admit for overnight observation  PATIENT DISPOSITION:  PACU - hemodynamically stable.   Delay start of Pharmacological VTE agent (>24hrs) due to surgical blood loss or risk of bleeding: yes

## 2011-10-23 NOTE — Transfer of Care (Signed)
Immediate Anesthesia Transfer of Care Note  Patient: Kimberly [name]  Procedure(s) Performed: Procedure(s) (LRB): LUMBAR LAMINECTOMY/DECOMPRESSION MICRODISCECTOMY 1 LEVEL (Left)  Patient Location: PACU  Anesthesia Type: General  Level of Consciousness: sedated  Airway & Oxygen Therapy: Patient Spontanous Breathing and Patient connected to nasal cannula oxygen  Post-op Assessment: Report given to PACU RN and Post -op Vital signs reviewed and stable  Post vital signs: Reviewed  Complications: No apparent anesthesia complications

## 2011-10-23 NOTE — Op Note (Signed)
Date of procedure: 10/23/2011   Date of dictation: Same  Service: Neurosurgery  Preoperative diagnosis: Left L4-5 herniated nucleus pulposus with radiculopathy  Postoperative diagnosis: Same  Procedure Name: Left L4-5 laminotomy and microdiscectomy  Surgeon:Jamarrion Budai A.Akashdeep Chuba, M.D.  Asst. Surgeon: Wynetta Emery  Anesthesia: General  Indication: 78 year old female with severe left lower extremity pain paresthesias and weakness consistent with a left-sided L5 radiculopathy. Workup demonstrates evidence of a left-sided L4-5 disc herniation with compression of the left-sided L5 nerve root. Patient presents now for laminotomy and microdiscectomy in hopes of improving her symptoms.  Operative note: After induction of anesthesia, patient positioned prone onto a Wilson frame and a properly padded. Number region was prepped and draped sterilely. Incision was made overlying the L4-5 interspace. Subperiosteal dissection then performed on the left side exposing the lamina and facet joints of L4 and L5. Deep self retaining retractor was placed intraoperative x-rays taken level was confirmed. Laminotomies then performed using high-speed drill and Kerrison rongeurs to remove the inferior aspect of lamina of L4 medial aspect the L4-5 facet joint and superior rim of the L5 lamina. Ligament flavum was elevated and resected piecemeal fashion using Kerrison rongeurs. Underlying thecal sac and neck L5 nerve root were identified. Microscope was then brought into the field and used for microdissection of the left-sided L5 nerve root and underlying discharge. Epidermides pus quite limited and cut. Thecal sac and L5 nerve root gently mobilized and retracted towards midline. Disc herniation was readily apparent. This is then incised 15 blade in a rectangular fashion. Y. disc space clear was achieved using pituitary rongeurs operative of pituitary rongeurs and Epstein curettes were almost the disc herniation resected. All loose are of  seizure disorders interspace. At this point a very thorough discectomy been achieved. There is no evidence of injury to thecal sac and nerve roots. Wound is then irrigated out solution. Gelfoam was placed topically for hemostasis which Ascent be good. Microscope retractor system were removed. Hemostasis muscles she will try repaired wounds and close in layers with Vicryl sutures. Steri-Strips and sterile dressing were applied. There were no apparent complications. Patient returns to the recovery room postop

## 2011-10-23 NOTE — Preoperative (Signed)
Beta Blockers   Reason not to administer Beta Blockers:Not Applicable. No home beta blockers 

## 2011-10-23 NOTE — H&P (Signed)
Kimberly [name] is an 78 y.o. female.   Chief Complaint: Left lower extremity pain HPI: 78 year old female with severe left buttock and left lower chamois pain failing conservative manner workup demonstrates evidence of a left-sided L4-5 disc herniation with compression the left-sided L5 nerve root. Patient presents now for laminotomy and microdiscectomy in hopes of improving her symptoms. Patient with no significant right-sided symptoms. Back pain is moderate. She is essentially nonambulatory secondary to this pain. She has no bowel or bladder dysfunction.  Past Medical History  Diagnosis Date  . Complication of anesthesia     SLOW TO WAKE UP  . Coronary artery disease   . Hypothyroidism   . GERD (gastroesophageal reflux disease)   . H/O hiatal hernia   . Neuromuscular disorder   . Arthritis     Past Surgical History  Procedure Date  . Coronary artery bypass graft 2007  . Cardiac catheterization   . Back surgery 28 YRS AGO  . Tonsillectomy     History reviewed. No pertinent family history. Social History:  reports that she has never smoked. She does not have any smokeless tobacco history on file. She reports that she does not drink alcohol or use illicit drugs.  Allergies:  Allergies  Allergen Reactions  . Amoxicillin Other (See Comments)    Reaction unknown per pt    Medications Prior to Admission  Medication Dose Route Frequency Provider Last Rate Last Dose  . dexamethasone (DECADRON) injection 10 mg  10 mg Intravenous Once Kimberly Pacini, Kimberly [name]      . HYDROmorphone (DILAUDID) injection 0.25-0.5 mg  0.25-0.5 mg Intravenous Q5 min PRN Kimberly Barbara, Kimberly [name]      . ondansetron [facility]) injection 4 mg  4 mg Intravenous Once PRN Kimberly Barbara, Kimberly [name]      . vancomycin (VANCOCIN) powder 500 mg  500 mg Other To OR Kimberly Pacini, Kimberly [name]       No current outpatient prescriptions on file as of 10/23/2011.    No results found for this or any previous visit (from the past 48  hour(s)). No results found.  Review of Systems  Constitutional: Negative.   HENT: Negative.   Eyes: Negative.   Respiratory: Negative.   Cardiovascular: Negative.   Gastrointestinal: Negative.   Genitourinary: Negative.   Musculoskeletal: Negative.   Skin: Negative.   Neurological: Negative.   Endo/Heme/Allergies: Negative.   Psychiatric/Behavioral: Negative.     Blood pressure 138/79, pulse 55, temperature 97.7 F (36.5 C), temperature source Oral, resp. rate 18, SpO2 98.00%. Physical Exam  Constitutional: She is oriented to person, place, and time. She appears well-developed and well-nourished.  HENT:  Head: Normocephalic and atraumatic.  Right Ear: External ear normal.  Left Ear: External ear normal.  Nose: Nose normal.  Mouth/Throat: Oropharynx is clear and moist.  Eyes: Conjunctivae and EOM are normal. Pupils are equal, round, and reactive to light.  Neck: Normal range of motion. Neck supple. No tracheal deviation present. No thyromegaly present.  Cardiovascular: Normal rate, regular rhythm, normal heart sounds and intact distal pulses.   Respiratory: Effort normal. No respiratory distress. She has no wheezes.  GI: Soft. Bowel sounds are normal. She exhibits no distension. There is no tenderness.  Musculoskeletal: Normal range of motion. She exhibits no edema and no tenderness.  Neurological: She is alert and oriented to person, place, and time. She has normal reflexes. She displays normal reflexes. No cranial nerve deficit. She exhibits normal muscle tone. Coordination normal.  Skin: Skin is  warm and dry. No rash noted. She is not diaphoretic. No erythema. No pallor.  Psychiatric: She has a normal mood and affect. Her behavior is normal. Judgment and thought content normal.     Assessment/Plan Left L4-5 herniated nucleus pulposus with radiculopathy. Plan left L4-5 laminotomy and microdiscectomy. Risks and benefits have been explained. Patient wishes to  proceed.  Kimberly [name] A 10/23/2011, 10:58 AM

## 2011-10-24 MED ORDER — CYCLOBENZAPRINE HCL 10 MG PO TABS: 10.0000 mg | ORAL_TABLET | Freq: Three times a day (TID) | ORAL | Status: AC | PRN

## 2011-10-24 MED ORDER — HYDROCODONE-ACETAMINOPHEN 5-325 MG PO TABS: 1.0000 | ORAL_TABLET | ORAL | Status: AC | PRN

## 2011-10-24 MED FILL — Sodium Chloride Irrigation Soln 0.9%: Qty: 500 | Status: AC

## 2011-10-24 NOTE — Progress Notes (Signed)
Discussed with patient and daughter incisional care and need to keep dry, back precautions including no bending, twisting and arching. Pt took rx home with her.  Rn advised she get prescriptions filled even though pt refused to take pain medicine in hospital.  IV removed tip intact. Pt left with daughter via wheelchair.

## 2011-10-24 NOTE — Discharge Summary (Signed)
Physician Discharge Summary  Patient ID: Kimberly [name] MRN: [id number] DOB/AGE: [id number] 76 y.o.  Admit date: 10/23/2011 Discharge date: 10/24/2011  Admission Diagnoses:  Discharge Diagnoses:  Principal Problem:  *Herniation of lumbar intervertebral disc with radiculopathy   Discharged Condition: good  Hospital Course: Admitted to the hospital where she underwent uncomplicated left-sided L4-5 laminotomy microdiscectomy. Postoperative she has done well. She's had complete resolution of her left lower extremity pain. She's had no other problems. Plan for discharge home.  Consults:   Significant Diagnostic Studies:   Treatments:   Discharge Exam: Blood pressure 107/61, pulse 58, temperature 97.5 F (36.4 C), temperature source Oral, resp. rate 18, height 5\' 4"  (1.626 m), weight 70.58 kg (155 lb 9.6 oz), SpO2 95.00%. She is awake and alert she is oriented and appropriate. Cranial nerve function is intact. Motor and sensory function extremities is normal. Wound is healing well. Chest and abdomen are benign.  Disposition:    Medication List  As of 10/24/2011 12:13 PM   TAKE these medications         aspirin EC 81 MG tablet   Take 81 mg by mouth every morning. HOLD FOR PROCEDURE      CALCIUM + D PO   Take 1 tablet by mouth 2 (two) times daily.      cyclobenzaprine 10 MG tablet   Commonly known as: FLEXERIL   Take 1 tablet (10 mg total) by mouth 3 (three) times daily as needed for muscle spasms.      esomeprazole 40 MG capsule   Commonly known as: NEXIUM   Take 40 mg by mouth 2 (two) times daily.      fish oil-omega-3 fatty acids 1000 MG capsule   Take 1 g by mouth every morning.      HYDROcodone-acetaminophen 5-325 MG per tablet   Commonly known as: NORCO   Take 1-2 tablets by mouth every 4 (four) hours as needed.      levothyroxine 50 MCG tablet   Commonly known as: SYNTHROID, LEVOTHROID   Take 50 mcg by mouth every morning. TAKE 1 HOUR BEFORE BREAKFAST     mulitivitamin with minerals Tabs   Take 1 tablet by mouth every morning.      simvastatin 80 MG tablet   Commonly known as: ZOCOR   Take 80 mg by mouth at bedtime.           Follow-up Information    Follow up with Jamiel Goncalves A, MD. Call in 1 week.   Contact information:   [address] [place] [address] [phone]          Signed: Temple Pacini 10/24/2011, 12:13 PM

## 2011-10-24 NOTE — Progress Notes (Signed)
Pt did not want to take most of her daily medicines. She refused to use the incentive spirometer. She refused her scd's.  Pt was thoroughly educated about the uses of incentive spirometer and the need for scd'd devices.  Pt flat out refused both.

## 2011-10-30 ENCOUNTER — Encounter (HOSPITAL_COMMUNITY): Payer: Self-pay | Admitting: Neurosurgery

## 2011-11-08 ENCOUNTER — Ambulatory Visit: Payer: Medicare Other | Attending: Neurosurgery

## 2011-11-08 DIAGNOSIS — IMO0001 Reserved for inherently not codable concepts without codable children: Secondary | ICD-10-CM | POA: Insufficient documentation

## 2011-11-08 DIAGNOSIS — R5381 Other malaise: Secondary | ICD-10-CM | POA: Insufficient documentation

## 2011-11-08 DIAGNOSIS — M545 Low back pain, unspecified: Secondary | ICD-10-CM | POA: Insufficient documentation

## 2011-11-08 DIAGNOSIS — M6281 Muscle weakness (generalized): Secondary | ICD-10-CM | POA: Insufficient documentation

## 2011-11-13 ENCOUNTER — Encounter: Payer: Medicare Other | Admitting: Physical Therapy

## 2011-12-06 ENCOUNTER — Other Ambulatory Visit: Payer: Self-pay | Admitting: Neurosurgery

## 2011-12-06 DIAGNOSIS — M48061 Spinal stenosis, lumbar region without neurogenic claudication: Secondary | ICD-10-CM

## 2011-12-08 ENCOUNTER — Ambulatory Visit
Admission: RE | Admit: 2011-12-08 | Discharge: 2011-12-08 | Disposition: A | Discharge status: Home / Self Care | Payer: Medicare Other | Source: Ambulatory Visit | Attending: Neurosurgery | Admitting: Neurosurgery

## 2011-12-08 DIAGNOSIS — M48061 Spinal stenosis, lumbar region without neurogenic claudication: Secondary | ICD-10-CM

## 2012-07-03 ENCOUNTER — Other Ambulatory Visit: Payer: Self-pay | Admitting: Gastroenterology

## 2012-07-03 DIAGNOSIS — K219 Gastro-esophageal reflux disease without esophagitis: Secondary | ICD-10-CM

## 2012-07-17 ENCOUNTER — Other Ambulatory Visit: Payer: Self-pay | Admitting: Gastroenterology

## 2012-07-17 ENCOUNTER — Ambulatory Visit
Admission: RE | Admit: 2012-07-17 | Discharge: 2012-07-17 | Disposition: A | Discharge status: Home / Self Care | Payer: Medicare Other | Source: Ambulatory Visit | Attending: Gastroenterology | Admitting: Gastroenterology

## 2012-07-17 DIAGNOSIS — K219 Gastro-esophageal reflux disease without esophagitis: Secondary | ICD-10-CM

## 2014-01-14 ENCOUNTER — Other Ambulatory Visit (HOSPITAL_COMMUNITY): Payer: Self-pay | Admitting: Family Medicine

## 2014-01-14 ENCOUNTER — Ambulatory Visit (HOSPITAL_COMMUNITY)
Admission: RE | Admit: 2014-01-14 | Discharge: 2014-01-14 | Disposition: A | Discharge status: Home / Self Care | Payer: Medicare Other | Source: Ambulatory Visit | Attending: Family Medicine | Admitting: Family Medicine

## 2014-01-14 NOTE — Progress Notes (Signed)
Pt was scheduled for 1400 January 14, 2014 for Reclast. Pt arrived at 1300 and states she was told she could come early.  Pt did not know who she spoke to regarding early arrival.  However, we already had a 1300 appointment and informed pt we could not get to her early.  Pt states she has to leave by 1400 today for another commitment.  Informed pt that we could not have her done by 1400 and suggested she reschedule.  Pt states she would like to reschedule and so her appointment was changed to January 16, 2014 at 0700.  Pt informed of this and she states she is okay with this.

## 2014-01-16 ENCOUNTER — Encounter (HOSPITAL_COMMUNITY): Payer: Self-pay

## 2014-01-16 ENCOUNTER — Ambulatory Visit (HOSPITAL_COMMUNITY)
Admission: RE | Admit: 2014-01-16 | Discharge: 2014-01-16 | Disposition: A | Discharge status: Home / Self Care | Payer: Medicare Other | Source: Ambulatory Visit | Attending: Family Medicine | Admitting: Family Medicine

## 2014-01-16 DIAGNOSIS — M810 Age-related osteoporosis without current pathological fracture: Secondary | ICD-10-CM | POA: Insufficient documentation

## 2014-01-16 MED ORDER — SODIUM CHLORIDE 0.9 % IV SOLN
Freq: Once | INTRAVENOUS | Status: AC
  Administered 2014-01-16: 08:00:00 via INTRAVENOUS

## 2014-01-16 MED ORDER — ZOLEDRONIC ACID 5 MG/100ML IV SOLN
5.0000 mg | Freq: Once | INTRAVENOUS | Status: AC
  Administered 2014-01-16: 5 mg via INTRAVENOUS
  Filled 2014-01-16: qty 100

## 2014-01-16 NOTE — Discharge Instructions (Signed)
Drink  Fluids/water as tolerated over the next 72 hours °Tylenol or ibuprofen if needed for aches and pains  °Continue Calcium and Vit D as directed by your MD ° ° ° ° °Reclast °Zoledronic Acid injection (Paget's Disease, Osteoporosis) °What is this medicine? °ZOLEDRONIC ACID (ZOE le dron ik AS id) lowers the amount of calcium loss from bone. It is used to treat Paget's disease and osteoporosis in women. °This medicine may be used for other purposes; ask your health care provider or pharmacist if you have questions. °COMMON BRAND NAME(S): Reclast, Zometa °What should I tell my health care provider before I take this medicine? °They need to know if you have any of these conditions: °-aspirin-sensitive asthma °-cancer, especially if you are receiving medicines used to treat cancer °-dental disease or wear dentures °-infection °-kidney disease °-low levels of calcium in the blood °-past surgery on the parathyroid gland or intestines °-receiving corticosteroids like dexamethasone or prednisone °-an unusual or allergic reaction to zoledronic acid, other medicines, foods, dyes, or preservatives °-pregnant or trying to get pregnant °-breast-feeding °How should I use this medicine? °This medicine is for infusion into a vein. It is given by a health care professional in a hospital or clinic setting. °Talk to your pediatrician regarding the use of this medicine in children. This medicine is not approved for use in children. °Overdosage: If you think you have taken too much of this medicine contact a poison control center or emergency room at once. °NOTE: This medicine is only for you. Do not share this medicine with others. °What if I miss a dose? °It is important not to miss your dose. Call your doctor or health care professional if you are unable to keep an appointment. °What may interact with this medicine? °-certain antibiotics given by injection °-NSAIDs, medicines for pain and inflammation, like ibuprofen or  naproxen °-some diuretics like bumetanide, furosemide °-teriparatide °This list may not describe all possible interactions. Give your health care provider a list of all the medicines, herbs, non-prescription drugs, or dietary supplements you use. Also tell them if you smoke, drink alcohol, or use illegal drugs. Some items may interact with your medicine. °What should I watch for while using this medicine? °Visit your doctor or health care professional for regular checkups. It may be some time before you see the benefit from this medicine. Do not stop taking your medicine unless your doctor tells you to. Your doctor may order blood tests or other tests to see how you are doing. °Women should inform their doctor if they wish to become pregnant or think they might be pregnant. There is a potential for serious side effects to an unborn child. Talk to your health care professional or pharmacist for more information. °You should make sure that you get enough calcium and vitamin D while you are taking this medicine. Discuss the foods you eat and the vitamins you take with your health care professional. °Some people who take this medicine have severe bone, joint, and/or muscle pain. This medicine may also increase your risk for jaw problems or a broken thigh bone. Tell your doctor right away if you have severe pain in your jaw, bones, joints, or muscles. Tell your doctor if you have any pain that does not go away or that gets worse. °Tell your dentist and dental surgeon that you are taking this medicine. You should not have major dental surgery while on this medicine. See your dentist to have a dental exam and fix any dental   problems before starting this medicine. Take good care of your teeth while on this medicine. Make sure you see your dentist for regular follow-up appointments. °What side effects may I notice from receiving this medicine? °Side effects that you should report to your doctor or health care professional as  soon as possible: °-allergic reactions like skin rash, itching or hives, swelling of the face, lips, or tongue °-anxiety, confusion, or depression °-breathing problems °-changes in vision °-eye pain °-feeling faint or lightheaded, falls °-jaw pain, especially after dental work °-mouth sores °-muscle cramps, stiffness, or weakness °-trouble passing urine or change in the amount of urine °Side effects that usually do not require medical attention (report to your doctor or health care professional if they continue or are bothersome): °-bone, joint, or muscle pain °-constipation °-diarrhea °-fever °-hair loss °-irritation at site where injected °-loss of appetite °-nausea, vomiting °-stomach upset °-trouble sleeping °-trouble swallowing °-weak or tired °This list may not describe all possible side effects. Call your doctor for medical advice about side effects. You may report side effects to FDA at 1-800-FDA-1088. °Where should I keep my medicine? °This drug is given in a hospital or clinic and will not be stored at home. °NOTE: This sheet is a summary. It may not cover all possible information. If you have questions about this medicine, talk to your doctor, pharmacist, or health care provider. °© 2014, [employer]. (2013-01-13 10:03:48) ° °

## 2014-01-21 ENCOUNTER — Encounter (HOSPITAL_COMMUNITY): Payer: Medicare Other

## 2014-03-18 ENCOUNTER — Telehealth: Payer: Self-pay | Admitting: Cardiology

## 2014-03-18 NOTE — Telephone Encounter (Signed)
Pt called to state that every once in a while  In her left arm, upper portion she gets a burning sensation. Once pt starts rubbing it it seems to go away. Pt states she has some pain in area around site with her last surgery we did. Pt does not think she has SOB. Pt knows she's due in September. She was ok with scheduling the appt today. Pt is ok to wait till then. To Dr Mayford Knifeurner to make aware.

## 2014-03-18 NOTE — Telephone Encounter (Signed)
If arm symptoms worsen please have her see her PCP

## 2014-03-18 NOTE — Telephone Encounter (Signed)
Pt is aware.  

## 2014-03-18 NOTE — Telephone Encounter (Signed)
New message          C/o pain in the upper left arm every once in a while / pt is due back in Sept , I offered pt Sept 2 at 10:30 pt declined / pt would like a call

## 2014-04-10 ENCOUNTER — Encounter: Payer: Self-pay | Admitting: General Surgery

## 2014-04-10 DIAGNOSIS — I2510 Atherosclerotic heart disease of native coronary artery without angina pectoris: Secondary | ICD-10-CM

## 2014-04-15 ENCOUNTER — Encounter: Payer: Self-pay | Admitting: Cardiology

## 2014-04-15 ENCOUNTER — Ambulatory Visit (INDEPENDENT_AMBULATORY_CARE_PROVIDER_SITE_OTHER): Payer: Medicare Other | Admitting: Cardiology

## 2014-04-15 VITALS — BP 126/68 | HR 66 | Ht 64.0 in | Wt 164.0 lb

## 2014-04-15 DIAGNOSIS — I2510 Atherosclerotic heart disease of native coronary artery without angina pectoris: Secondary | ICD-10-CM

## 2014-04-15 DIAGNOSIS — E785 Hyperlipidemia, unspecified: Secondary | ICD-10-CM

## 2014-04-15 DIAGNOSIS — R011 Cardiac murmur, unspecified: Secondary | ICD-10-CM

## 2014-04-15 DIAGNOSIS — I209 Angina pectoris, unspecified: Secondary | ICD-10-CM

## 2014-04-15 DIAGNOSIS — I25119 Atherosclerotic heart disease of native coronary artery with unspecified angina pectoris: Secondary | ICD-10-CM

## 2014-04-15 MED ORDER — ATORVASTATIN CALCIUM 80 MG PO TABS: 80.0000 mg | ORAL_TABLET | Freq: Every day | ORAL | Status: DC

## 2014-04-15 MED ORDER — ATORVASTATIN CALCIUM 80 MG PO TABS
80.0000 mg | ORAL_TABLET | Freq: Every day | ORAL | Status: DC
Start: 2014-04-15 — End: 2014-04-15

## 2014-04-15 NOTE — Patient Instructions (Addendum)
Your physician has recommended you make the following change in your medication: 1. Stop Simvastatin 2. Start Lipitor 80 Mg 1 tablet daily  Your physician recommends that you return for a FASTING lipid profile and ALT in 6 Weeks on 05/27/14  Your physician has requested that you have an echocardiogram. Echocardiography is a painless test that uses sound waves to create images of your heart. It provides your doctor with information about the size and shape of your heart and how well your heart's chambers and valves are working. This procedure takes approximately one hour. There are no restrictions for this procedure.  Your physician has requested that you have a lexiscan myoview. For further information please visit [email or web address] Please follow instruction sheet, as given.  Your physician wants you to follow-up in: 1 year with Dr Sherlyn Lick will receive a reminder letter in the mail two months in advance. If you don't receive a letter, please call our office to schedule the follow-up appointment.

## 2014-04-15 NOTE — Progress Notes (Signed)
[address], [address] [place], [place]  [address] Phone: [phone] Fax:  [phone]  Date:  04/15/2014   ID:  Kimberly [name], DOB 06/17/1931, MRN [id number]  PCP:  Mickie Hillier, MD  Cardiologist:  Armanda Magic, MD     History of Present Illness: This is an 80yo female with a history of ASCAD s/p CABG and dyslipidemia who presents today for followup.  She is doing well.  She denies any chest pain, SOB, DOE, LE edema, dizziness, palpitations or syncope.  She is concerned because she has been having a burning sensation in her upper arm along with a hurting sensation.  She says that she will rub it and it will improve but she is very concerned that it might be her heart.    Wt Readings from Last 3 Encounters:  04/15/14 164 lb (74.39 kg)  01/16/14 160 lb (72.576 kg)  10/24/11 155 lb 9.6 oz (70.58 kg)     Past Medical History  Diagnosis Date  . Complication of anesthesia     SLOW TO WAKE UP  . GERD (gastroesophageal reflux disease)   . H/O hiatal hernia     w distal esophageal ulceration  . Neuromuscular disorder   . Arthritis   . Vertigo   . Dyslipidemia   . Angina pectoris   . Vertigo   . Osteoporosis   . Coronary artery disease     S/P CABG  . Hypothyroidism   . Chest pain, non-cardiac   . Esophageal reflux     Current Outpatient Prescriptions  Medication Sig Dispense Refill  . aspirin EC 81 MG tablet Take 81 mg by mouth every morning. HOLD FOR PROCEDURE      . Calcium Carbonate-Vitamin D (CALCIUM + D PO) Take 1 tablet by mouth daily.       Marland Kitchen esomeprazole (NEXIUM) 40 MG capsule Take 40 mg by mouth 2 (two) times daily.      Marland Kitchen levothyroxine (SYNTHROID, LEVOTHROID) 50 MCG tablet Take 50 mcg by mouth every morning. TAKE 1 HOUR BEFORE BREAKFAST      . Multiple Vitamin (MULITIVITAMIN WITH MINERALS) TABS Take 1 tablet by mouth every morning.      . pantoprazole (PROTONIX) 40 MG tablet Take 40 mg by mouth 2 (two) times daily before a meal.      . simvastatin  (ZOCOR) 80 MG tablet Take 80 mg by mouth at bedtime.       No current facility-administered medications for this visit.    Allergies:    Allergies  Allergen Reactions  . Amoxicillin Other (See Comments)    Reaction unknown per pt    Social History:  The patient  reports that she has never smoked. She has never used smokeless tobacco. She reports that she does not drink alcohol or use illicit drugs.   Family History:  The patient's family history includes High Cholesterol in her sister.   ROS:  Please see the history of present illness.      All other systems reviewed and negative.   PHYSICAL EXAM: VS:  BP 126/68  Pulse 66  Ht  (1.626 m)  Wt 164 lb (74.39 kg)  BMI 28.14 kg/m2 Well nourished, well developed, in no acute distress HEENT: normal Neck: no JVD Cardiac:  normal S1, S2; RRR; 2/6 SM at RUSB to LLSB Lungs:  clear to auscultation bilaterally, no wheezing, rhonchi or rales Abd: soft, nontender, no hepatomegaly Ext: no edema Skin: warm and dry Neuro:  CNs  2-12 intact, no focal abnormalities noted   ASSESSMENT AND PLAN:  1. ASCAD s/p CABG with no chest pain but she has been having intermittent left arm burning and aching that could be an anginal equivalent but also could be musculoskeletal - continue ASA - Lexiscan myoview to rule out ischemia 2. Dyslipidemia - her last LDL was 83 and her goal is <70 - recheck FLP and ALT in 6 weeks after starting Lipitor - She currently is on Simvastatin  daily and it is now recommended that patients not be on a dose higher than .  Since her LDL is still not at goal with  of simvastatin I will change her to Lipitor  daily.   3.  Heart murmur - check 2D echo  Followup with me in 1 year  Signed, Armanda Magic, MD 04/15/2014 10:49 AM

## 2014-04-15 NOTE — Addendum Note (Signed)
Addended by: Nita Sells on: 04/15/2014 11:10 AM   Modules accepted: Orders

## 2014-04-29 ENCOUNTER — Ambulatory Visit (HOSPITAL_COMMUNITY): Payer: Medicare Other | Attending: Cardiology | Admitting: Radiology

## 2014-04-29 ENCOUNTER — Ambulatory Visit (HOSPITAL_BASED_OUTPATIENT_CLINIC_OR_DEPARTMENT_OTHER): Payer: Medicare Other | Admitting: Radiology

## 2014-04-29 VITALS — BP 142/68 | HR 46 | Ht 64.0 in | Wt 159.0 lb

## 2014-04-29 DIAGNOSIS — E785 Hyperlipidemia, unspecified: Secondary | ICD-10-CM | POA: Insufficient documentation

## 2014-04-29 DIAGNOSIS — I25119 Atherosclerotic heart disease of native coronary artery with unspecified angina pectoris: Secondary | ICD-10-CM

## 2014-04-29 DIAGNOSIS — I2510 Atherosclerotic heart disease of native coronary artery without angina pectoris: Secondary | ICD-10-CM

## 2014-04-29 DIAGNOSIS — R011 Cardiac murmur, unspecified: Secondary | ICD-10-CM | POA: Diagnosis present

## 2014-04-29 MED ORDER — REGADENOSON 0.4 MG/5ML IV SOLN
0.4000 mg | Freq: Once | INTRAVENOUS | Status: AC
  Administered 2014-04-29: 0.4 mg via INTRAVENOUS

## 2014-04-29 MED ORDER — TECHNETIUM TC 99M SESTAMIBI GENERIC - CARDIOLITE
33.0000 | Freq: Once | INTRAVENOUS | Status: AC | PRN
  Administered 2014-04-29: 33 via INTRAVENOUS

## 2014-04-29 MED ORDER — TECHNETIUM TC 99M SESTAMIBI GENERIC - CARDIOLITE
11.0000 | Freq: Once | INTRAVENOUS | Status: AC | PRN
  Administered 2014-04-29: 11 via INTRAVENOUS

## 2014-04-29 NOTE — Progress Notes (Signed)
[facility] SITE 3 NUCLEAR MED [address] [place], [place] [address] [phone]    Cardiology Nuclear Med Study  Kimberly [name] is a 80 y.o. female     MRN : [id number]     DOB: 10/11/1930  Procedure Date: 04/29/2014  Nuclear Med Background Indication for Stress Test:  Evaluation for Ischemia and Graft Patency History:  CAD Cardiac Risk Factors: Lipids  Symptoms:  Burning sensation in upper arm which also hurts   Nuclear Pre-Procedure Caffeine/Decaff Intake:  None> 12 hrs NPO After: 7:00pm   Lungs:  clear O2 Sat: 97% on room air. IV 0.9% NS with Angio Cath:  22g  IV Site: R Wrist x 1, tolerated well IV Started by:  Irean Hong, RN  Chest Size (in):  40 Cup Size: D  Height:  (1.626 m)  Weight:  159 lb (72.122 kg)  BMI:  Body mass index is 27.28 kg/(m^2). Tech Comments:  N/A    Nuclear Med Study 1 or 2 day study: 1 day  Stress Test Type:  Treadmill/Lexiscan  Reading MD: N/A  Order Authorizing Provider:  Armanda Magic, MD  Resting Radionuclide: Technetium 78m Sestamibi  Resting Radionuclide Dose: 11.0 mCi   Stress Radionuclide:  Technetium 1m Sestamibi  Stress Radionuclide Dose: 33.0 mCi           Stress Protocol Rest HR: 46 Stress HR: 111  Rest BP: 142/68 Stress BP: 92/60  Exercise Time (min): n/a METS: n/a           Dose of Adenosine (mg):  n/a Dose of Lexiscan: 0.4 mg  Dose of Atropine (mg): n/a Dose of Dobutamine: n/a mcg/kg/min (at max HR)  Stress Test Technologist: Nelson Chimes, BS-ES  Nuclear Technologist:  Kerby Nora, CNMT     Rest Procedure:  Myocardial perfusion imaging was performed at rest 45 minutes following the intravenous administration of Technetium 4m Sestamibi. Rest ECG: NSR - Normal EKG  Stress Procedure:  The patient received IV Lexiscan 0.4 mg over 15-seconds with concurrent low level exercise and then Technetium 106m Sestamibi was injected at 30-seconds while the patient continued walking one more minute.   Quantitative spect images were obtained after a 45-minute delay.  During the infusion of Lexiscan the patient complained of arm weakness and head pressure.  These symptoms began to resolve in recovery.  Stress ECG: Insignificant upsloping ST segment depression.  QPS Raw Data Images:  Normal; no motion artifact; normal heart/lung ratio. Stress Images:  Medium-sized mild basal to mid inferior and basal inferolateral perfusion defect. Rest Images:  Medium-sized mild basal to mid inferior and basal inferolateral perfusion defect. Subtraction (SDS):  Primarily fixed medium-sized mild basal to mid inferior and basal inferolateral perfusion defect. Transient Ischemic Dilatation (Normal <1.22):  1.30 Lung/Heart Ratio (Normal <0.45):  0.25  Quantitative Gated Spect Images QGS EDV:  80 ml QGS ESV:  35 ml  Impression Exercise Capacity:  Lexiscan with low level exercise. BP Response:  Normal blood pressure response. Clinical Symptoms:  Arm weakness.  ECG Impression:  Insignificant upsloping ST segment depression. Comparison with Prior Nuclear Study: No images to compare  Overall Impression:  Low risk stress nuclear study with a primarily fixed medium-sized mild basal to mid inferior and basal inferolateral perfusion defect.. Given normal wall motion, this may be artifact.  No significant ischemia.   LV Ejection Fraction: 57%.  LV Wall Motion:  NL LV Function; NL Wall Motion  Kimberly [name] 04/29/2014

## 2014-04-29 NOTE — Progress Notes (Signed)
Echocardiogram performed.  

## 2014-04-30 ENCOUNTER — Other Ambulatory Visit: Payer: Self-pay | Admitting: General Surgery

## 2014-04-30 ENCOUNTER — Encounter: Payer: Self-pay | Admitting: Cardiology

## 2014-04-30 DIAGNOSIS — I059 Rheumatic mitral valve disease, unspecified: Secondary | ICD-10-CM

## 2014-04-30 DIAGNOSIS — I351 Nonrheumatic aortic (valve) insufficiency: Secondary | ICD-10-CM

## 2014-04-30 HISTORY — DX: Nonrheumatic aortic (valve) insufficiency: I35.1

## 2014-05-05 ENCOUNTER — Telehealth: Payer: Self-pay | Admitting: Cardiology

## 2014-05-05 NOTE — Telephone Encounter (Signed)
Gave pt echo and stress test results. She asked that I mail her a copy and fax over to Dr Darrol Poke office. I was able to do that for pt.

## 2014-05-05 NOTE — Telephone Encounter (Signed)
Follow up:    Per pt she needs a call back about her test results today please.

## 2014-05-27 ENCOUNTER — Encounter: Payer: Self-pay | Admitting: General Surgery

## 2014-05-27 ENCOUNTER — Other Ambulatory Visit (INDEPENDENT_AMBULATORY_CARE_PROVIDER_SITE_OTHER): Payer: Medicare Other | Admitting: *Deleted

## 2014-05-27 DIAGNOSIS — E785 Hyperlipidemia, unspecified: Secondary | ICD-10-CM

## 2014-05-27 LAB — LIPID PANEL
Cholesterol: 140 mg/dL (ref 0–200)
HDL: 52.2 mg/dL (ref >39.00)
LDL Cholesterol: 74 mg/dL (ref 0–99)
NonHDL: 87.8
Total CHOL/HDL Ratio: 3
Triglycerides: 67 mg/dL (ref 0.0–149.0)
VLDL: 13.4 mg/dL (ref 0.0–40.0)

## 2014-05-27 LAB — ALT: ALT: 30 U/L (ref 0–35)

## 2014-06-02 ENCOUNTER — Telehealth: Payer: Self-pay | Admitting: Cardiology

## 2014-06-02 NOTE — Telephone Encounter (Signed)
New message ° ° ° ° °Want lab results °

## 2014-06-02 NOTE — Telephone Encounter (Signed)
Spoke with patient today. She received her letter in the mail with lab results. Wants us to forward results to Dr. Wyline BeadyK Little her PCP, and she wants their office to do her refills for Lipitor. Explained that we could do them here, but she wants Dr. Fredirick MaudlinLittle's office to do it. Labs routed to Dr. Kirtland BouchardK Little's office.

## 2014-06-03 ENCOUNTER — Other Ambulatory Visit: Payer: Self-pay

## 2014-06-03 ENCOUNTER — Telehealth: Payer: Self-pay

## 2014-06-03 DIAGNOSIS — E785 Hyperlipidemia, unspecified: Secondary | ICD-10-CM

## 2014-06-03 MED ORDER — ATORVASTATIN CALCIUM 80 MG PO TABS: 80.0000 mg | ORAL_TABLET | Freq: Every day | ORAL | Status: DC

## 2014-06-03 MED ORDER — ATORVASTATIN CALCIUM 80 MG PO TABS: 80.0000 mg | ORAL_TABLET | Freq: Every day | ORAL | Status: AC

## 2014-06-03 NOTE — Telephone Encounter (Signed)
Patient called requesting that we mail a copy of her last o/v and her lad results be mailed to Dr Dolphus JennyKevin Kimberly [name]'s [facility] [place] [id number]. I also called the doctors office and talked to some one on the doctor line. Patient told me that she wanted Dr Clarene DukeLittle to prescribe her Lipitor

## 2014-12-02 ENCOUNTER — Encounter (HOSPITAL_COMMUNITY): Payer: Self-pay

## 2014-12-02 ENCOUNTER — Other Ambulatory Visit (HOSPITAL_COMMUNITY): Payer: Self-pay | Admitting: Family Medicine

## 2014-12-02 ENCOUNTER — Ambulatory Visit (HOSPITAL_COMMUNITY)
Admission: RE | Admit: 2014-12-02 | Discharge: 2014-12-02 | Disposition: A | Discharge status: Home / Self Care | Payer: Medicare Other | Source: Ambulatory Visit | Attending: Family Medicine | Admitting: Family Medicine

## 2014-12-02 MED ORDER — ZOLEDRONIC ACID 5 MG/100ML IV SOLN
5.0000 mg | Freq: Once | INTRAVENOUS | Status: DC
Start: 2014-12-02 — End: 2014-12-03
  Filled 2014-12-02: qty 100

## 2014-12-02 MED ORDER — SODIUM CHLORIDE 0.9 % IV SOLN
Freq: Once | INTRAVENOUS | Status: DC
Start: 2014-12-02 — End: 2014-12-03

## 2014-12-02 NOTE — Progress Notes (Signed)
Pt arrived today for Reclast infusion.  Orders read that this was the pt's first infusion of Reclast.  Upon further investigation because pt states she was here last year, it was discovered that the pt received her Reclast January 16, 2014.  It has to be 366 days between infusions.  Pt was informed of this and the Reclast was not given today.  Apologized for the inconvenience and showed pt to the exit door.  Office called and notified.

## 2014-12-02 NOTE — Discharge Instructions (Signed)

## 2015-01-20 ENCOUNTER — Encounter: Payer: Self-pay | Admitting: Cardiology

## 2015-01-27 ENCOUNTER — Encounter (HOSPITAL_COMMUNITY): Payer: Medicare Other

## 2015-02-01 ENCOUNTER — Encounter (HOSPITAL_COMMUNITY): Payer: Self-pay

## 2015-02-01 ENCOUNTER — Ambulatory Visit (HOSPITAL_COMMUNITY)
Admission: RE | Admit: 2015-02-01 | Discharge: 2015-02-01 | Disposition: A | Discharge status: Home / Self Care | Payer: Medicare Other | Source: Ambulatory Visit | Attending: Family Medicine | Admitting: Family Medicine

## 2015-02-01 DIAGNOSIS — M810 Age-related osteoporosis without current pathological fracture: Secondary | ICD-10-CM | POA: Diagnosis present

## 2015-02-01 MED ORDER — SODIUM CHLORIDE 0.9 % IV SOLN
250.0000 mL | Freq: Once | INTRAVENOUS | Status: AC
  Administered 2015-02-01: 250 mL via INTRAVENOUS

## 2015-02-01 MED ORDER — ZOLEDRONIC ACID 5 MG/100ML IV SOLN
5.0000 mg | Freq: Once | INTRAVENOUS | Status: AC
  Administered 2015-02-01: 5 mg via INTRAVENOUS
  Filled 2015-02-01: qty 100

## 2015-02-01 NOTE — Discharge Instructions (Signed)

## 2015-04-21 ENCOUNTER — Encounter: Payer: Self-pay | Admitting: Cardiology

## 2015-04-21 ENCOUNTER — Telehealth: Payer: Self-pay | Admitting: Cardiology

## 2015-04-21 ENCOUNTER — Ambulatory Visit (INDEPENDENT_AMBULATORY_CARE_PROVIDER_SITE_OTHER): Payer: Medicare Other | Admitting: Cardiology

## 2015-04-21 VITALS — BP 140/64 | HR 55 | Ht 64.0 in | Wt 155.8 lb

## 2015-04-21 DIAGNOSIS — I2510 Atherosclerotic heart disease of native coronary artery without angina pectoris: Secondary | ICD-10-CM

## 2015-04-21 DIAGNOSIS — I351 Nonrheumatic aortic (valve) insufficiency: Secondary | ICD-10-CM | POA: Diagnosis not present

## 2015-04-21 DIAGNOSIS — E785 Hyperlipidemia, unspecified: Secondary | ICD-10-CM

## 2015-04-21 NOTE — Progress Notes (Signed)
Cardiology Office Note   Date:  04/21/2015   ID:  Kimberly [name], DOB 12-28-1930, MRN [id number]  PCP:  Mickie Hillier, MD    Chief Complaint  Patient presents with  . Atherosclerosis      History of Present Illness: This is an 81yo female with a history of ASCAD s/p CABG and dyslipidemia who presents today for followup. She is doing well. She denies any chest pain, SOB, DOE, LE edema, dizziness, palpitations or syncope. She has been having leg cramps recently when she does water aerobics.  This has improved with apple cider vinegar.  She also is having problems with hoarseness for the past 3 weeks. She denies any fever or cough.  She does have a history of GERD.      Past Medical History  Diagnosis Date  . Complication of anesthesia     SLOW TO WAKE UP  . GERD (gastroesophageal reflux disease)   . H/O hiatal hernia     w distal esophageal ulceration  . Neuromuscular disorder   . Arthritis   . Vertigo   . Dyslipidemia   . Angina pectoris   . Vertigo   . Osteoporosis   . Coronary artery disease     S/P CABG  . Hypothyroidism   . Chest pain, non-cardiac   . Esophageal reflux   . Aortic regurgitation 04/30/2014    Past Surgical History  Procedure Laterality Date  . Coronary artery bypass graft  2007  . Cardiac catheterization    . Back surgery  28 YRS AGO  . Tonsillectomy    . Laminectomy  10/23/2011  . Lumbar laminectomy/decompression microdiscectomy  10/23/2011    Procedure: LUMBAR LAMINECTOMY/DECOMPRESSION MICRODISCECTOMY 1 LEVEL;  Surgeon: Temple Pacini, MD;  Location: [facility];  Service: Neurosurgery;  Laterality: Left;  Left Lumbar Four-Five Microdiskectomy/Laminectomy  . Coronary artery bypass graft  12/2005  . Esophagogastroduodenoscopy      Multiple  . Flexible sigmoidoscopy  09/1998     Current Outpatient Prescriptions  Medication Sig Dispense Refill  . aspirin EC 81 MG tablet Take 81 mg by mouth every morning. HOLD FOR  PROCEDURE    . atorvastatin (LIPITOR) 80 MG tablet Take 1 tablet (80 mg total) by mouth daily. 30 tablet 0  . Calcium Carbonate-Vitamin D (CALCIUM + D PO) Take 1 tablet by mouth daily.     Marland Kitchen esomeprazole (NEXIUM) 40 MG capsule Take 40 mg by mouth 2 (two) times daily.    Marland Kitchen levothyroxine (SYNTHROID, LEVOTHROID) 50 MCG tablet Take 50 mcg by mouth at bedtime.    . Multiple Vitamin (MULITIVITAMIN WITH MINERALS) TABS Take 1 tablet by mouth every morning.    . pantoprazole (PROTONIX) 40 MG tablet Take 40 mg by mouth 2 (two) times daily before a meal.     No current facility-administered medications for this visit.    Allergies:   Amoxicillin    Social History:  The patient  reports that she has never smoked. She has never used smokeless tobacco. She reports that she does not drink alcohol or use illicit drugs.   Family History:  The patient's family history includes High Cholesterol in her sister.    ROS:  Please see the history of present illness.   Otherwise, review of systems are positive for none.   All other systems are reviewed and negative.    PHYSICAL EXAM: VS:  BP 140/64 mmHg  Pulse 55  Ht  (1.626 m)  Wt 155 lb 12.8 oz (70.67 kg)  BMI 26.73 kg/m2  SpO2 98% , BMI Body mass index is 26.73 kg/(m^2). GEN: Well nourished, well developed, in no acute distress HEENT: normal Neck: no JVD, carotid bruits, or masses Cardiac: RRR; no murmurs, rubs, or gallops,no edema  Respiratory:  clear to auscultation bilaterally, normal work of breathing GI: soft, nontender, nondistended, + BS MS: no deformity or atrophy Skin: warm and dry, no rash Neuro:  Strength and sensation are intact Psych: euthymic mood, full affect   EKG:  EKG is not ordered today.    Recent Labs: 05/27/2014: ALT 30    Lipid Panel    Component Value Date/Time   CHOL 140 05/27/2014 0845   TRIG 67.0 05/27/2014 0845   HDL 52.20 05/27/2014 0845   CHOLHDL 3 05/27/2014 0845   VLDL 13.4 05/27/2014 0845    LDLCALC 74 05/27/2014 0845      Wt Readings from Last 3 Encounters:  04/21/15 155 lb 12.8 oz (70.67 kg)  04/29/14 159 lb (72.122 kg)  04/15/14 164 lb (74.39 kg)     ASSESSMENT AND PLAN:  1.  ASCAD s/p CABG with no chest pain - lexiscan myoview 04/2014 with no ischemia - continue ASA 2.  Dyslipidemia - her last LDL was 74 and her goal is <70 - recheck FLP and ALT from PCP office - continue Lipitor 3.Mild to moderate AR and mild MR - repeat echo 4.  Hoarseness with a history of GERD - she is not taking a PPI daily.  I suspect that her hoarseness may be due to GERD.  I have recommended that she see ENT for evaluation of her hoarseness.  She is concerned that this may be due to lipitor but I have recommended an ENT eval before attributing it to a med.   Current medicines are reviewed at length with the patient today.  The patient does not have concerns regarding medicines.  The following changes have been made:  no change  Labs/ tests ordered today: See above Assessment and Plan No orders of the defined types were placed in this encounter.     Disposition:   FU with me in 1 year  Signed, Quintella Reichert, MD  04/21/2015 3:17 PM    [facility] [address] [place], [place], [place]  [address] Phone: [phone]; Fax: [phone]

## 2015-04-21 NOTE — Telephone Encounter (Signed)
Per Dr. Mayford Knife, recommended Dr. Annalee Genta as ENT. Patient grateful for call.

## 2015-04-21 NOTE — Patient Instructions (Signed)
Medication Instructions:  Your physician recommends that you continue on your current medications as directed. Please refer to the Current Medication list given to you today.   Labwork: None  Testing/Procedures: Your physician has requested that you have an echocardiogram. Echocardiography is a painless test that uses sound waves to create images of your heart. It provides your doctor with information about the size and shape of your heart and how well your heart's chambers and valves are working. This procedure takes approximately one hour. There are no restrictions for this procedure.  Follow-Up: Your physician wants you to follow-up in: 1 year with Dr. Turner. You will receive a reminder letter in the mail two months in advance. If you don't receive a letter, please call our office to schedule the follow-up appointment.   Any Other Special Instructions Will Be Listed Below (If Applicable).   

## 2015-04-21 NOTE — Telephone Encounter (Signed)
New message     Pt did not receive names of ear, nose and throat doctors today at her appt w/ Dr Mayford Knife Please call to give her those names

## 2015-05-06 ENCOUNTER — Other Ambulatory Visit: Payer: Self-pay

## 2015-05-06 ENCOUNTER — Ambulatory Visit (HOSPITAL_COMMUNITY): Payer: Medicare Other | Attending: Cardiology

## 2015-05-06 DIAGNOSIS — E785 Hyperlipidemia, unspecified: Secondary | ICD-10-CM | POA: Diagnosis not present

## 2015-05-06 DIAGNOSIS — I351 Nonrheumatic aortic (valve) insufficiency: Secondary | ICD-10-CM

## 2015-05-06 DIAGNOSIS — I358 Other nonrheumatic aortic valve disorders: Secondary | ICD-10-CM | POA: Diagnosis not present

## 2015-05-06 DIAGNOSIS — I340 Nonrheumatic mitral (valve) insufficiency: Secondary | ICD-10-CM | POA: Insufficient documentation

## 2015-05-06 DIAGNOSIS — Z951 Presence of aortocoronary bypass graft: Secondary | ICD-10-CM | POA: Insufficient documentation

## 2015-05-06 DIAGNOSIS — I517 Cardiomegaly: Secondary | ICD-10-CM | POA: Insufficient documentation

## 2016-02-07 ENCOUNTER — Other Ambulatory Visit (HOSPITAL_COMMUNITY): Payer: Self-pay | Admitting: Family Medicine

## 2016-02-07 ENCOUNTER — Ambulatory Visit (HOSPITAL_COMMUNITY)
Admission: RE | Admit: 2016-02-07 | Discharge: 2016-02-07 | Disposition: A | Discharge status: Home / Self Care | Payer: Medicare Other | Source: Ambulatory Visit | Attending: Family Medicine | Admitting: Family Medicine

## 2016-02-07 ENCOUNTER — Encounter (INDEPENDENT_AMBULATORY_CARE_PROVIDER_SITE_OTHER): Payer: Self-pay

## 2016-02-07 ENCOUNTER — Encounter (HOSPITAL_COMMUNITY): Payer: Self-pay

## 2016-02-07 DIAGNOSIS — M810 Age-related osteoporosis without current pathological fracture: Secondary | ICD-10-CM | POA: Diagnosis present

## 2016-02-07 HISTORY — DX: Essential (primary) hypertension: I10

## 2016-02-07 MED ORDER — ZOLEDRONIC ACID 5 MG/100ML IV SOLN
5.0000 mg | Freq: Once | INTRAVENOUS | Status: AC
  Administered 2016-02-07: 5 mg via INTRAVENOUS
  Filled 2016-02-07: qty 100

## 2016-02-07 MED ORDER — SODIUM CHLORIDE 0.9 % IV SOLN
Freq: Once | INTRAVENOUS | Status: AC
  Administered 2016-02-07: 11:00:00 via INTRAVENOUS

## 2016-02-07 NOTE — Discharge Instructions (Signed)
RECLAST °Zoledronic Acid injection (Paget's Disease, Osteoporosis) °What is this medicine? °ZOLEDRONIC ACID (ZOE le dron ik AS id) lowers the amount of calcium loss from bone. It is used to treat Paget's disease and osteoporosis in women. °This medicine may be used for other purposes; ask your health care provider or pharmacist if you have questions. °What should I tell my health care provider before I take this medicine? °They need to know if you have any of these conditions: °-aspirin-sensitive asthma °-cancer, especially if you are receiving medicines used to treat cancer °-dental disease or wear dentures °-infection °-kidney disease °-low levels of calcium in the blood °-past surgery on the parathyroid gland or intestines °-receiving corticosteroids like dexamethasone or prednisone °-an unusual or allergic reaction to zoledronic acid, other medicines, foods, dyes, or preservatives °-pregnant or trying to get pregnant °-breast-feeding °How should I use this medicine? °This medicine is for infusion into a vein. It is given by a health care professional in a hospital or clinic setting. °Talk to your pediatrician regarding the use of this medicine in children. This medicine is not approved for use in children. °Overdosage: If you think you have taken too much of this medicine contact a poison control center or emergency room at once. °NOTE: This medicine is only for you. Do not share this medicine with others. °What if I miss a dose? °It is important not to miss your dose. Call your doctor or health care professional if you are unable to keep an appointment. °What may interact with this medicine? °-certain antibiotics given by injection °-NSAIDs, medicines for pain and inflammation, like ibuprofen or naproxen °-some diuretics like bumetanide, furosemide °-teriparatide °This list may not describe all possible interactions. Give your health care provider a list of all the medicines, herbs, non-prescription drugs, or  dietary supplements you use. Also tell them if you smoke, drink alcohol, or use illegal drugs. Some items may interact with your medicine. °What should I watch for while using this medicine? °Visit your doctor or health care professional for regular checkups. It may be some time before you see the benefit from this medicine. Do not stop taking your medicine unless your doctor tells you to. Your doctor may order blood tests or other tests to see how you are doing. °Women should inform their doctor if they wish to become pregnant or think they might be pregnant. There is a potential for serious side effects to an unborn child. Talk to your health care professional or pharmacist for more information. °You should make sure that you get enough calcium and vitamin D while you are taking this medicine. Discuss the foods you eat and the vitamins you take with your health care professional. °Some people who take this medicine have severe bone, joint, and/or muscle pain. This medicine may also increase your risk for jaw problems or a broken thigh bone. Tell your doctor right away if you have severe pain in your jaw, bones, joints, or muscles. Tell your doctor if you have any pain that does not go away or that gets worse. °Tell your dentist and dental surgeon that you are taking this medicine. You should not have major dental surgery while on this medicine. See your dentist to have a dental exam and fix any dental problems before starting this medicine. Take good care of your teeth while on this medicine. Make sure you see your dentist for regular follow-up appointments. °What side effects may I notice from receiving this medicine? °Side effects that you   should report to your doctor or health care professional as soon as possible: °-allergic reactions like skin rash, itching or hives, swelling of the face, lips, or tongue °-anxiety, confusion, or depression °-breathing problems °-changes in vision °-eye pain °-feeling faint or  lightheaded, falls °-jaw pain, especially after dental work °-mouth sores °-muscle cramps, stiffness, or weakness °-redness, blistering, peeling or loosening of the skin, including inside the mouth °-trouble passing urine or change in the amount of urine °Side effects that usually do not require medical attention (report to your doctor or health care professional if they continue or are bothersome): °-bone, joint, or muscle pain °-constipation °-diarrhea °-fever °-hair loss °-irritation at site where injected °-loss of appetite °-nausea, vomiting °-stomach upset °-trouble sleeping °-trouble swallowing °-weak or tired °This list may not describe all possible side effects. Call your doctor for medical advice about side effects. You may report side effects to FDA at 1-800-FDA-1088. °Where should I keep my medicine? °This drug is given in a hospital or clinic and will not be stored at home. °NOTE: This sheet is a summary. It may not cover all possible information. If you have questions about this medicine, talk to your doctor, pharmacist, or health care provider. °  °© 2016, Elsevier/Gold Standard. (2013-12-27 14:19:57) °Osteoporosis °Osteoporosis is the thinning and loss of density in the bones. Osteoporosis makes the bones more brittle, fragile, and likely to break (fracture). Over time, osteoporosis can cause the bones to become so weak that they fracture after a simple fall. The bones most likely to fracture are the bones in the hip, wrist, and spine. °CAUSES  °The exact cause is not known. °RISK FACTORS °Anyone can develop osteoporosis. You may be at greater risk if you have a family history of the condition or have poor nutrition. You may also have a higher risk if you are:  °· Female.   °· 50 years old or older. °· A smoker. °· Not physically active.   °· White or Asian. °· Slender. °SIGNS AND SYMPTOMS  °A fracture might be the first sign of the disease, especially if it results from a fall or injury that would  not usually cause a bone to break. Other signs and symptoms include:  °· Low back and neck pain. °· Stooped posture. °· Height loss. °DIAGNOSIS  °To make a diagnosis, your health care provider may: °· Take a medical history. °· Perform a physical exam. °· Order tests, such as: °¨ A bone mineral density test. °¨ A dual-energy X-ray absorptiometry test. °TREATMENT  °The goal of osteoporosis treatment is to strengthen your bones to reduce your risk of a fracture. Treatment may involve: °· Making lifestyle changes, such as: °¨ Eating a diet rich in calcium. °¨ Doing weight-bearing and muscle-strengthening exercises. °¨ Stopping tobacco use. °¨ Limiting alcohol intake. °· Taking medicine to slow the process of bone loss or to increase bone density. °· Monitoring your levels of calcium and vitamin D. °HOME CARE INSTRUCTIONS °· Include calcium and vitamin D in your diet. Calcium is important for bone health, and vitamin D helps the body absorb calcium. °· Perform weight-bearing and muscle-strengthening exercises as directed by your health care provider. °· Do not use any tobacco products, including cigarettes, chewing tobacco, and electronic cigarettes. If you need help quitting, ask your health care provider. °· Limit your alcohol intake. °· Take medicines only as directed by your health care provider. °· Keep all follow-up visits as directed by your health care provider. This is important. °· Take   precautions at home to lower your risk of falling, such as: °¨ Keeping rooms well lit and clutter free. °¨ Installing safety rails on stairs. °¨ Using rubber mats in the bathroom and other areas that are often wet or slippery. °SEEK IMMEDIATE MEDICAL CARE IF:  °You fall or injure yourself.  °  °This information is not intended to replace advice given to you by your health care provider. Make sure you discuss any questions you have with your health care provider. °  °Document Released: 05/10/2005 Document Revised: 08/21/2014  Document Reviewed: 01/08/2014 °Elsevier Interactive Patient Education ©2016 [employer]vier Inc. ° ° °

## 2016-04-07 ENCOUNTER — Encounter: Payer: Self-pay | Admitting: Cardiology

## 2016-04-20 ENCOUNTER — Encounter: Payer: Self-pay | Admitting: Cardiology

## 2016-04-20 NOTE — Progress Notes (Signed)
Cardiology Office Note    Date:  04/21/2016   ID:  Kimberly [name], DOB 15-Oct-1930, MRN [id number]  PCP:  Mickie Hillier, MD  Cardiologist:  Armanda Magic, MD   Chief Complaint  Patient presents with  . Coronary Artery Disease  . Hyperlipidemia    History of Present Illness:  Kimberly [name] is a 82 y.o. female with a history of ASCAD s/p CABG, HTN and dyslipidemia who presents today for followup. She is doing well. She denies any chest pain, SOB, DOE, LE edema, dizziness, palpitations or syncope.    Past Medical History:  Diagnosis Date  . Aortic regurgitation 04/30/2014  . Arthritis   . Benign essential HTN   . Complication of anesthesia    SLOW TO WAKE UP  . Coronary artery disease    S/P CABG  . Dyslipidemia   . GERD (gastroesophageal reflux disease)   . H/O hiatal hernia    w distal esophageal ulceration  . Hypertension   . Hypothyroidism   . Neuromuscular disorder (HCC)   . Osteoporosis   . Vertigo     Past Surgical History:  Procedure Laterality Date  . BACK SURGERY  28 YRS AGO  . CARDIAC CATHETERIZATION    . CORONARY ARTERY BYPASS GRAFT  2007  . CORONARY ARTERY BYPASS GRAFT  12/2005  . ESOPHAGOGASTRODUODENOSCOPY     Multiple  . FLEXIBLE SIGMOIDOSCOPY  09/1998  . LAMINECTOMY  10/23/2011  . LUMBAR LAMINECTOMY/DECOMPRESSION MICRODISCECTOMY  10/23/2011   Procedure: LUMBAR LAMINECTOMY/DECOMPRESSION MICRODISCECTOMY 1 LEVEL;  Surgeon: Temple Pacini, MD;  Location: [facility];  Service: Neurosurgery;  Laterality: Left;  Left Lumbar Four-Five Microdiskectomy/Laminectomy  . TONSILLECTOMY      Current Medications: Outpatient Medications Prior to Visit  Medication Sig Dispense Refill  . aspirin EC 81 MG tablet Take 81 mg by mouth every morning. HOLD FOR PROCEDURE    . atorvastatin (LIPITOR) 80 MG tablet Take 1 tablet (80 mg total) by mouth daily. 30 tablet 0  . Calcium Carbonate-Vitamin D (CALCIUM + D PO) Take 1 tablet by mouth daily.     Marland Kitchen levothyroxine  (SYNTHROID, LEVOTHROID) 50 MCG tablet Take 50 mcg by mouth at bedtime.    Marland Kitchen losartan (COZAAR) 25 MG tablet Take 25 mg by mouth daily.    . Multiple Vitamin (MULITIVITAMIN WITH MINERALS) TABS Take 1 tablet by mouth every morning.    . pantoprazole (PROTONIX) 40 MG tablet Take 40 mg by mouth 2 (two) times daily before a meal.    . vitamin B-12 (CYANOCOBALAMIN) 1000 MCG tablet Take 1,000 mcg by mouth daily.    Marland Kitchen esomeprazole (NEXIUM) 40 MG capsule Take 40 mg by mouth 2 (two) times daily.    Marland Kitchen oxymetazoline (AFRIN) 0.05 % nasal spray Place 1 spray into both nostrils 2 (two) times daily.     No facility-administered medications prior to visit.      Allergies:   Amoxicillin   Social History   Social History  . Marital status: Widowed    Spouse name: N/A  . Number of children: N/A  . Years of education: N/A   Social History Main Topics  . Smoking status: Never Smoker  . Smokeless tobacco: Never Used  . Alcohol use No  . Drug use: No  . Sexual activity: Not Currently    Birth control/ protection: Post-menopausal   Other Topics Concern  . None   Social History Narrative  . None     Family History:  The patient's family  history includes High Cholesterol in her sister.   ROS:   Please see the history of present illness.    ROS All other systems reviewed and are negative.   PHYSICAL EXAM:   VS:  BP 140/60   Pulse 60   Ht 5\' 4"  (1.626 m)   Wt 156 lb (70.8 kg)   BMI 26.78 kg/m    GEN: Well nourished, well developed, in no acute distress  HEENT: normal  Neck: no JVD, carotid bruits, or masses Cardiac: RRR; no murmurs, rubs, or gallops,no edema.  Intact distal pulses bilaterally.  Respiratory:  clear to auscultation bilaterally, normal work of breathing GI: soft, nontender, nondistended, + BS MS: no deformity or atrophy  Skin: warm and dry, no rash Neuro:  Alert and Oriented x 3, Strength and sensation are intact Psych: euthymic mood, full affect  Wt Readings from Last 3  Encounters:  04/21/16 156 lb (70.8 kg)  02/07/16 159 lb 6.4 oz (72.3 kg)  04/21/15 155 lb 12.8 oz (70.7 kg)      Studies/Labs Reviewed:   EKG:  EKG is not ordered today.    Recent Labs: No results found for requested labs within last 8760 hours.   Lipid Panel    Component Value Date/Time   CHOL 140 05/27/2014 0845   TRIG 67.0 05/27/2014 0845   HDL 52.20 05/27/2014 0845   CHOLHDL 3 05/27/2014 0845   VLDL 13.4 05/27/2014 0845   LDLCALC 74 05/27/2014 0845    Additional studies/ records that were reviewed today include:  None    ASSESSMENT:    1. Atherosclerosis of native coronary artery of native heart without angina pectoris   2. Aortic regurgitation   3. Dyslipidemia   4. Benign essential HTN     PLAN:  In order of problems listed above:  1. ASCAD s/p CABG with no angina.  Continue ASA/statin. 2. Mild aortic insufficiency by echo 04/2015 3. Hyperlipidemia with LDL goal < 70.  Continue statin.  Get FLP and ALT. 4. HTN - BP controlled on current meds.  Continue ARB.    Medication Adjustments/Labs and Tests Ordered: Current medicines are reviewed at length with the patient today.  Concerns regarding medicines are outlined above.  Medication changes, Labs and Tests ordered today are listed in the Patient Instructions below.  There are no Patient Instructions on file for this visit.   Signed, Armanda Magicraci Turner, MD  04/21/2016 10:12 AM    [facility] [address], [place], [place]  [address] Phone: [phone]; Fax: [phone]

## 2016-04-21 ENCOUNTER — Ambulatory Visit (INDEPENDENT_AMBULATORY_CARE_PROVIDER_SITE_OTHER): Payer: Medicare Other | Admitting: Cardiology

## 2016-04-21 ENCOUNTER — Encounter: Payer: Self-pay | Admitting: Cardiology

## 2016-04-21 VITALS — BP 140/60 | HR 60 | Ht 64.0 in | Wt 156.0 lb

## 2016-04-21 DIAGNOSIS — I351 Nonrheumatic aortic (valve) insufficiency: Secondary | ICD-10-CM

## 2016-04-21 DIAGNOSIS — I10 Essential (primary) hypertension: Secondary | ICD-10-CM | POA: Insufficient documentation

## 2016-04-21 DIAGNOSIS — I2510 Atherosclerotic heart disease of native coronary artery without angina pectoris: Secondary | ICD-10-CM | POA: Diagnosis not present

## 2016-04-21 DIAGNOSIS — E785 Hyperlipidemia, unspecified: Secondary | ICD-10-CM | POA: Diagnosis not present

## 2016-04-21 NOTE — Patient Instructions (Signed)

## 2017-04-11 ENCOUNTER — Other Ambulatory Visit: Payer: Self-pay | Admitting: Family Medicine

## 2017-04-11 ENCOUNTER — Ambulatory Visit
Admission: RE | Admit: 2017-04-11 | Discharge: 2017-04-11 | Disposition: A | Discharge status: Home / Self Care | Payer: Medicare Other | Source: Ambulatory Visit | Attending: Family Medicine | Admitting: Family Medicine

## 2017-04-11 DIAGNOSIS — N289 Disorder of kidney and ureter, unspecified: Secondary | ICD-10-CM

## 2017-05-15 ENCOUNTER — Ambulatory Visit: Payer: Medicare Other | Admitting: Cardiology

## 2017-05-16 ENCOUNTER — Ambulatory Visit (INDEPENDENT_AMBULATORY_CARE_PROVIDER_SITE_OTHER): Payer: Medicare Other | Admitting: Cardiology

## 2017-05-16 ENCOUNTER — Encounter: Payer: Self-pay | Admitting: Cardiology

## 2017-05-16 VITALS — BP 110/62 | HR 61 | Ht 64.0 in | Wt 156.6 lb

## 2017-05-16 DIAGNOSIS — I351 Nonrheumatic aortic (valve) insufficiency: Secondary | ICD-10-CM

## 2017-05-16 DIAGNOSIS — I10 Essential (primary) hypertension: Secondary | ICD-10-CM | POA: Diagnosis not present

## 2017-05-16 DIAGNOSIS — E785 Hyperlipidemia, unspecified: Secondary | ICD-10-CM | POA: Diagnosis not present

## 2017-05-16 DIAGNOSIS — I2510 Atherosclerotic heart disease of native coronary artery without angina pectoris: Secondary | ICD-10-CM | POA: Diagnosis not present

## 2017-05-16 NOTE — Patient Instructions (Signed)
Medication Instructions:  Your physician recommends that you continue on your current medications as directed. Please refer to the Current Medication list given to you today.   Labwork: none  Testing/Procedures: Your physician has requested that you have an echocardiogram. Echocardiography is a painless test that uses sound waves to create images of your heart. It provides your doctor with information about the size and shape of your heart and how well your heart's chambers and valves are working. This procedure takes approximately one hour. There are no restrictions for this procedure.    Follow-Up: Your physician wants you to follow-up in: 1 year with Dr Mayford Knife. (October 2019). You will receive a reminder letter in the mail two months in advance. If you don't receive a letter, please call our office to schedule the follow-up appointment.   Any Other Special Instructions Will Be Listed Below (If Applicable).     If you need a refill on your cardiac medications before your next appointment, please call your pharmacy.

## 2017-05-16 NOTE — Progress Notes (Signed)
Cardiology Office Note:    Date:  05/16/2017   ID:  Kimberly [name], DOB 29-Sep-1930, MRN [id number]  PCP:  Catha Gosselin, MD  Cardiologist:  Armanda Magic, MD   Referring MD: Catha Gosselin, MD   Chief Complaint  Patient presents with  . Coronary Artery Disease  . Hypertension  . Hyperlipidemia    History of Present Illness:    Kimberly [name] is a 82 y.o. female with a hx of ASCAD s/p CABG, HTN and dyslipidemia.  She is here today for followup and is doing well.  She denies any chest pain or pressure, SOB, DOE, PND, orthopnea, LE edema, dizziness, palpitations or syncope. She is compliant with her meds and is tolerating meds with no SE.    Past Medical History:  Diagnosis Date  . Aortic regurgitation 04/30/2014  . Arthritis   . Benign essential HTN   . Complication of anesthesia    SLOW TO WAKE UP  . Coronary artery disease    S/P CABG  . Dyslipidemia   . GERD (gastroesophageal reflux disease)   . H/O hiatal hernia    w distal esophageal ulceration  . Hypertension   . Hypothyroidism   . Neuromuscular disorder (HCC)   . Osteoporosis   . Vertigo     Past Surgical History:  Procedure Laterality Date  . BACK SURGERY  28 YRS AGO  . CARDIAC CATHETERIZATION    . CORONARY ARTERY BYPASS GRAFT  2007  . CORONARY ARTERY BYPASS GRAFT  12/2005  . ESOPHAGOGASTRODUODENOSCOPY     Multiple  . FLEXIBLE SIGMOIDOSCOPY  09/1998  . LAMINECTOMY  10/23/2011  . LUMBAR LAMINECTOMY/DECOMPRESSION MICRODISCECTOMY  10/23/2011   Procedure: LUMBAR LAMINECTOMY/DECOMPRESSION MICRODISCECTOMY 1 LEVEL;  Surgeon: Temple Pacini, MD;  Location: [facility];  Service: Neurosurgery;  Laterality: Left;  Left Lumbar Four-Five Microdiskectomy/Laminectomy  . TONSILLECTOMY      Current Medications: Current Meds  Medication Sig  . aspirin EC 81 MG tablet Take 81 mg by mouth every morning. HOLD FOR PROCEDURE  . atorvastatin (LIPITOR) 80 MG tablet Take 1 tablet (80 mg total) by mouth daily.  . Calcium  Carbonate-Vitamin D (CALCIUM + D PO) Take 1 tablet by mouth daily.   Marland Kitchen levothyroxine (SYNTHROID, LEVOTHROID) 50 MCG tablet Take 50 mcg by mouth at bedtime.  Marland Kitchen losartan (COZAAR) 25 MG tablet Take 25 mg by mouth daily.  . Multiple Vitamin (MULITIVITAMIN WITH MINERALS) TABS Take 1 tablet by mouth every morning.  . pantoprazole (PROTONIX) 40 MG tablet Take 40 mg by mouth 2 (two) times daily before a meal.  . vitamin B-12 (CYANOCOBALAMIN) 1000 MCG tablet Take 1,000 mcg by mouth daily.     Allergies:   Amoxicillin   Social History   Social History  . Marital status: Widowed    Spouse name: N/A  . Number of children: N/A  . Years of education: N/A   Social History Main Topics  . Smoking status: Never Smoker  . Smokeless tobacco: Never Used  . Alcohol use No  . Drug use: No  . Sexual activity: Not Currently    Birth control/ protection: Post-menopausal   Other Topics Concern  . None   Social History Narrative  . None     Family History: The patient's family history includes High Cholesterol in her sister.  ROS:   Please see the history of present illness.    ROS  Hearing loss otherwise All other systems reviewed and negative.   EKGs/Labs/Other Studies Reviewed:  The following studies were reviewed today: none  EKG:  EKG is not ordered today at patient's request.   Recent Labs: No results found for requested labs within last 8760 hours.   Recent Lipid Panel    Component Value Date/Time   CHOL 140 05/27/2014 0845   TRIG 67.0 05/27/2014 0845   HDL 52.20 05/27/2014 0845   CHOLHDL 3 05/27/2014 0845   VLDL 13.4 05/27/2014 0845   LDLCALC 74 05/27/2014 0845    Physical Exam:    VS:  BP 110/62   Pulse 61   Ht  (1.626 m)   Wt 156 lb 9.6 oz (71 kg)   SpO2 96%   BMI 26.88 kg/m     Wt Readings from Last 3 Encounters:  05/16/17 156 lb 9.6 oz (71 kg)  04/21/16 156 lb (70.8 kg)  02/07/16 159 lb 6.4 oz (72.3 kg)     GEN:  Well nourished, well developed in  no acute distress HEENT: Normal NECK: No JVD; No carotid bruits LYMPHATICS: No lymphadenopathy CARDIAC: RRR, no murmurs, rubs, gallops RESPIRATORY:  Clear to auscultation without rales, wheezing or rhonchi  ABDOMEN: Soft, non-tender, non-distended MUSCULOSKELETAL:  No edema; No deformity  SKIN: Warm and dry NEUROLOGIC:  Alert and oriented x 3 PSYCHIATRIC:  Normal affect   ASSESSMENT:    1. Atherosclerosis of native coronary artery of native heart without angina pectoris   2. Benign essential HTN   3. Dyslipidemia   4. Nonrheumatic aortic valve insufficiency    PLAN:    In order of problems listed above:  1.  ASCAD - s/p CABG.  She denies any angina. She will continue on ASA  daily and statin.    2.  HTN - Her BP is well controlled on exam today.  She will continue on Losartan  daily.    3.  Hyperlipidemia - her LDL goal is < 70.  She will continue on high dose Lipitor  daily.  Her LDL was 70 in June 2018  4.  Mild AR - I will repeat an echo to reassess since it has been 2 years.     Medication Adjustments/Labs and Tests Ordered: Current medicines are reviewed at length with the patient today.  Concerns regarding medicines are outlined above.  No orders of the defined types were placed in this encounter.  No orders of the defined types were placed in this encounter.   Signed, Armanda Magic, MD  05/16/2017 11:23 AM    [facility]

## 2017-05-18 ENCOUNTER — Ambulatory Visit (HOSPITAL_COMMUNITY): Payer: Medicare Other | Attending: Cardiovascular Disease

## 2017-05-18 ENCOUNTER — Other Ambulatory Visit: Payer: Self-pay

## 2017-05-18 DIAGNOSIS — Z951 Presence of aortocoronary bypass graft: Secondary | ICD-10-CM | POA: Insufficient documentation

## 2017-05-18 DIAGNOSIS — I351 Nonrheumatic aortic (valve) insufficiency: Secondary | ICD-10-CM

## 2017-05-18 DIAGNOSIS — Z8249 Family history of ischemic heart disease and other diseases of the circulatory system: Secondary | ICD-10-CM | POA: Diagnosis not present

## 2017-05-18 DIAGNOSIS — I082 Rheumatic disorders of both aortic and tricuspid valves: Secondary | ICD-10-CM | POA: Insufficient documentation

## 2017-05-18 DIAGNOSIS — E785 Hyperlipidemia, unspecified: Secondary | ICD-10-CM

## 2017-05-18 DIAGNOSIS — I10 Essential (primary) hypertension: Secondary | ICD-10-CM

## 2017-05-18 DIAGNOSIS — I2510 Atherosclerotic heart disease of native coronary artery without angina pectoris: Secondary | ICD-10-CM

## 2017-05-22 ENCOUNTER — Encounter: Payer: Self-pay | Admitting: Cardiology

## 2017-05-22 DIAGNOSIS — I272 Pulmonary hypertension, unspecified: Secondary | ICD-10-CM | POA: Insufficient documentation

## 2017-05-22 DIAGNOSIS — I350 Nonrheumatic aortic (valve) stenosis: Secondary | ICD-10-CM | POA: Insufficient documentation

## 2018-06-12 NOTE — Progress Notes (Signed)
Cardiology Office Note:    Date:  06/13/2018   ID:  Kimberly [name], DOB Jun 12, 1931, MRN [id number]  PCP:  Catha Gosselin, MD  Cardiologist:  No primary care provider on file.    Referring MD: Catha Gosselin, MD   Chief Complaint  Patient presents with  . Coronary Artery Disease  . Hypertension  . Hyperlipidemia    History of Present Illness:    Kimberly [name] is a 84 y.o. female with a hx of ASCAD s/p CABG, HTNand dyslipidemia.  She is here today for followup and is doing well.  She denies any chest pain or pressure, SOB, DOE(except when hiking up inclines), PND, orthopnea, LE edema, dizziness, palpitations or syncope. She is compliant with her meds and is tolerating meds with no SE.    Past Medical History:  Diagnosis Date  . Aortic regurgitation 04/30/2014   mild by echo 05/2017  . Aortic stenosis    mild by echo 05/2017  . Arthritis   . Benign essential HTN   . Complication of anesthesia    SLOW TO WAKE UP  . Coronary artery disease    S/P CABG  . Dyslipidemia   . GERD (gastroesophageal reflux disease)   . H/O hiatal hernia    w distal esophageal ulceration  . Hypertension   . Hypothyroidism   . Neuromuscular disorder (HCC)   . Osteoporosis   . Pulmonary HTN (HCC)    mild to moderate with PASP by echo 05/2017  . Vertigo     Past Surgical History:  Procedure Laterality Date  . BACK SURGERY  28 YRS AGO  . CARDIAC CATHETERIZATION    . CORONARY ARTERY BYPASS GRAFT  2007  . CORONARY ARTERY BYPASS GRAFT  12/2005  . ESOPHAGOGASTRODUODENOSCOPY     Multiple  . FLEXIBLE SIGMOIDOSCOPY  09/1998  . LAMINECTOMY  10/23/2011  . LUMBAR LAMINECTOMY/DECOMPRESSION MICRODISCECTOMY  10/23/2011   Procedure: LUMBAR LAMINECTOMY/DECOMPRESSION MICRODISCECTOMY 1 LEVEL;  Surgeon: Temple Pacini, MD;  Location: [facility];  Service: Neurosurgery;  Laterality: Left;  Left Lumbar Four-Five Microdiskectomy/Laminectomy  . TONSILLECTOMY      Current Medications: No outpatient  medications have been marked as taking for the 06/13/18 encounter (Office Visit) with Quintella Reichert, MD.     Allergies:   Amoxicillin   Social History   Socioeconomic History  . Marital status: Widowed    Spouse name: Not on file  . Number of children: Not on file  . Years of education: Not on file  . Highest education level: Not on file  Occupational History  . Not on file  Social Needs  . Financial resource strain: Not on file  . Food insecurity:    Worry: Not on file    Inability: Not on file  . Transportation needs:    Medical: Not on file    Non-medical: Not on file  Tobacco Use  . Smoking status: Never Smoker  . Smokeless tobacco: Never Used  Substance and Sexual Activity  . Alcohol use: No  . Drug use: No  . Sexual activity: Not Currently    Birth control/protection: Post-menopausal  Lifestyle  . Physical activity:    Days per week: Not on file    Minutes per session: Not on file  . Stress: Not on file  Relationships  . Social connections:    Talks on phone: Not on file    Gets together: Not on file    Attends religious service: Not on file  Active member of club or organization: Not on file    Attends meetings of clubs or organizations: Not on file    Relationship status: Not on file  Other Topics Concern  . Not on file  Social History Narrative  . Not on file     Family History: The patient's family history includes High Cholesterol in her sister.  ROS:   Please see the history of present illness.    ROS  All other systems reviewed and negative.   EKGs/Labs/Other Studies Reviewed:    The following studies were reviewed today: none  EKG:  EKG is not ordered today.    Recent Labs: No results found for requested labs within last 8760 hours.   Recent Lipid Panel    Component Value Date/Time   CHOL 140 05/27/2014 0845   TRIG 67.0 05/27/2014 0845   HDL 52.20 05/27/2014 0845   CHOLHDL 3 05/27/2014 0845   VLDL 13.4 05/27/2014 0845    LDLCALC 74 05/27/2014 0845    Physical Exam:    VS:  There were no vitals taken for this visit.    Wt Readings from Last 3 Encounters:  05/16/17 156 lb 9.6 oz (71 kg)  04/21/16 156 lb (70.8 kg)  02/07/16 159 lb 6.4 oz (72.3 kg)     GEN:  Well nourished, well developed in no acute distress HEENT: Normal NECK: No JVD; No carotid bruits LYMPHATICS: No lymphadenopathy CARDIAC: RRR, no murmurs, rubs, gallops RESPIRATORY:  Clear to auscultation without rales, wheezing or rhonchi  ABDOMEN: Soft, non-tender, non-distended MUSCULOSKELETAL:  No edema; No deformity  SKIN: Warm and dry NEUROLOGIC:  Alert and oriented x 3 PSYCHIATRIC:  Normal affect   ASSESSMENT:    1. Atherosclerosis of native coronary artery of native heart without angina pectoris   2. Nonrheumatic aortic valve insufficiency   3. Benign essential HTN   4. Pulmonary HTN (HCC)   5. Nonrheumatic aortic valve stenosis   6. Dyslipidemia    PLAN:    In order of problems listed above:  1.  ASCAD - s/p remote CABG.  She denies any anginal symptoms but is concerned because she is going to a [place] in the mountains in [place] and worries about the altitude that will be around 10,000 ft.  She had problems with DOE and diaphoresis when she went hiking in [place] and would like a stress test to make sure her heart is ok before she goes to [place].  I will get a lexiscan myoview.  She will continue on ASA 81mg  daily and statin.   2.  Aortic insufficiency - mild with PHT by echo a year ago.  3.  HTN - BP is controlled on exam today.  She will continue on Lisinopril 5mg  daily.  Her creatinine was stable at 0.93 on 02/28/2018.    4.  Pulmonary HTN -  Mild to moderate pulmonary HTN with PASP by echo a year ago.  I will repeat an echo to make sure this is stable.   5.  Aortic stenosis - very mild by echo a year ago.    6.  Hyperlipidemia - LDL goal is < 70.  Se will continue on atorvastatin 80mg  daily.  Her LDL was 67 in July  2019.    Medication Adjustments/Labs and Tests Ordered: Current medicines are reviewed at length with the patient today.  Concerns regarding medicines are outlined above.  No orders of the defined types were placed in this encounter.  No orders of  the defined types were placed in this encounter.   Signed, Armanda Magic, MD  06/13/2018 8:22 AM    [facility]

## 2018-06-13 ENCOUNTER — Ambulatory Visit: Payer: Medicare Other | Admitting: Cardiology

## 2018-06-13 ENCOUNTER — Encounter: Payer: Self-pay | Admitting: Cardiology

## 2018-06-13 VITALS — BP 136/60 | HR 63 | Ht 64.0 in | Wt 157.2 lb

## 2018-06-13 DIAGNOSIS — I351 Nonrheumatic aortic (valve) insufficiency: Secondary | ICD-10-CM | POA: Diagnosis not present

## 2018-06-13 DIAGNOSIS — I350 Nonrheumatic aortic (valve) stenosis: Secondary | ICD-10-CM

## 2018-06-13 DIAGNOSIS — I272 Pulmonary hypertension, unspecified: Secondary | ICD-10-CM | POA: Diagnosis not present

## 2018-06-13 DIAGNOSIS — R0602 Shortness of breath: Secondary | ICD-10-CM

## 2018-06-13 DIAGNOSIS — I10 Essential (primary) hypertension: Secondary | ICD-10-CM | POA: Diagnosis not present

## 2018-06-13 DIAGNOSIS — I2510 Atherosclerotic heart disease of native coronary artery without angina pectoris: Secondary | ICD-10-CM

## 2018-06-13 DIAGNOSIS — E785 Hyperlipidemia, unspecified: Secondary | ICD-10-CM

## 2018-06-13 NOTE — Patient Instructions (Signed)
Medication Instructions:  Your physician recommends that you continue on your current medications as directed. Please refer to the Current Medication list given to you today.  If you need a refill on your cardiac medications before your next appointment, please call your pharmacy.   Lab work:  If you have labs (blood work) drawn today and your tests are completely normal, you will receive your results only by: Marland Kitchen MyChart Message (if you have MyChart) OR . A paper copy in the mail If you have any lab test that is abnormal or we need to change your treatment, we will call you to review the results.  Testing/Procedures: Your physician has requested that you have a lexiscan myoview. For further information please visit [email or web address] Please follow instruction sheet, as given.  Follow-Up: At [facility], you and your health needs are our priority.  As part of our continuing mission to provide you with exceptional heart care, we have created designated Provider Care Teams.  These Care Teams include your primary Cardiologist (physician) and Advanced Practice Providers (APPs -  Physician Assistants and Nurse Practitioners) who all work together to provide you with the care you need, when you need it. You will need a follow up appointment in 1 years.  Please call our office 2 months in advance to schedule this appointment.  You may see Dr. Mayford Knife or one of the following Advanced Practice Providers on your designated Care Team:   [place], PA-C Ronie Spies, PA-C . Jacolyn Reedy, PA-C

## 2018-06-24 ENCOUNTER — Telehealth (HOSPITAL_COMMUNITY): Payer: Self-pay | Admitting: *Deleted

## 2018-06-24 NOTE — Telephone Encounter (Signed)
Patient given detailed instructions per Myocardial Perfusion Study Information Sheet for the test on 06/26/18 at 1030. Patient notified to arrive 15 minutes early and that it is imperative to arrive on time for appointment to keep from having the test rescheduled.  If you need to cancel or reschedule your appointment, please call the office within 24 hours of your appointment. . Patient verbalized understanding.Hasspacher, Adelene Idler

## 2018-06-26 ENCOUNTER — Ambulatory Visit (HOSPITAL_COMMUNITY): Payer: Medicare Other | Attending: Cardiovascular Disease

## 2018-06-26 VITALS — Ht 64.0 in | Wt 157.0 lb

## 2018-06-26 DIAGNOSIS — R110 Nausea: Secondary | ICD-10-CM

## 2018-06-26 DIAGNOSIS — R0602 Shortness of breath: Secondary | ICD-10-CM

## 2018-06-26 LAB — MYOCARDIAL PERFUSION IMAGING
LV dias vol: 79 mL (ref 46–106)
LV sys vol: 30 mL
Peak HR: 93 {beats}/min
Rest HR: 48 {beats}/min
SDS: 5
SRS: 3
SSS: 9
TID: 0.9

## 2018-06-26 MED ORDER — REGADENOSON 0.4 MG/5ML IV SOLN
0.4000 mg | Freq: Once | INTRAVENOUS | Status: AC
  Administered 2018-06-26: 0.4 mg via INTRAVENOUS

## 2018-06-26 MED ORDER — AMINOPHYLLINE 25 MG/ML IV SOLN
75.0000 mg | Freq: Once | INTRAVENOUS | Status: AC
  Administered 2018-06-26: 75 mg via INTRAVENOUS

## 2018-06-26 MED ORDER — TECHNETIUM TC 99M TETROFOSMIN IV KIT
30.4000 | PACK | Freq: Once | INTRAVENOUS | Status: AC | PRN
  Administered 2018-06-26: 30.4 via INTRAVENOUS
  Filled 2018-06-26: qty 31

## 2018-06-26 MED ORDER — TECHNETIUM TC 99M TETROFOSMIN IV KIT
10.4000 | PACK | Freq: Once | INTRAVENOUS | Status: AC | PRN
  Administered 2018-06-26: 10.4 via INTRAVENOUS
  Filled 2018-06-26: qty 11

## 2018-06-27 ENCOUNTER — Telehealth: Payer: Self-pay | Admitting: Cardiology

## 2018-06-27 NOTE — Telephone Encounter (Signed)
New message ° ° °Patient is calling to get echo results. °

## 2018-06-27 NOTE — Telephone Encounter (Signed)
Pt given her Stress test results.  

## 2019-05-05 ENCOUNTER — Ambulatory Visit: Payer: Medicare Other | Admitting: Cardiology

## 2019-05-05 ENCOUNTER — Encounter: Payer: Self-pay | Admitting: Physician Assistant

## 2019-05-05 NOTE — Progress Notes (Signed)
Cardiology Office Note    Date:  05/08/2019   ID:  Kimberly [name], DOB [birth year], MRN [id number]  PCP:  Catha Gosselin, MD  Cardiologist:  Armanda Magic, MD  Electrophysiologist:  None   Chief Complaint: 1 year f/u CAD  History of Present Illness:   Kimberly [name] is a 85 y.o. female with history of CAD s/p remote CABG in 2007, mildly dilated ascending aorta, mild AS/AI, dyslipidemia (followed by PCP), HTN, arthritis, GERD, hiatal hernia, hypothyroidism, pulmonary HTN who is here for follow-up. Last echo 05/2017 showed mild LVH, EF 60-65%, grade 1 DD, mild AS/AI, mildly dilated ascending aorta, mild increase PASP . Nuclear stress test 06/2018 was normal, EF 62%. Last labs in [facility] showed 02/2019 normal LFTs, Cr 0.97, LDL 67, Hgb 14.5, K 5.0, TSH wnl, trig 71.  She returns for follow-up overall feeling stable from cardiac standpoint. She has mild chronic DOE which has not really changed over the last year. The emotional weight of the pandemic has been hard for her as she is a very social creature and tended to be very active before everything shut down. She recently re-joined water aerobics at the [facility] and it has helped her wellbeing. She refused an EKG today, citing there is no need to have one done. 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  Past Medical History:  Diagnosis Date  . Aortic regurgitation 04/30/2014   mild by echo 05/2017  . Aortic stenosis    mild by echo 2018  . Arthritis   . Benign essential HTN   . Complication of anesthesia    SLOW TO WAKE UP  . Coronary artery disease    S/P CABG 2007  . Dyslipidemia   . GERD (gastroesophageal reflux disease)   . H/O hiatal hernia    w distal esophageal ulceration  . Hypertension   . Hypothyroidism   . Mild dilation of ascending aorta (HCC)   . Neuromuscular disorder (HCC)   . Osteoporosis   . Pulmonary HTN (HCC)    mild to moderate with PASP by echo 05/2017  . Vertigo      Past Surgical History:  Procedure Laterality Date  . BACK SURGERY  28 YRS AGO  . CARDIAC CATHETERIZATION    . CORONARY ARTERY BYPASS GRAFT  2007  . CORONARY ARTERY BYPASS GRAFT  12/2005  . ESOPHAGOGASTRODUODENOSCOPY     Multiple  . FLEXIBLE SIGMOIDOSCOPY  09/1998  . LAMINECTOMY  10/23/2011  . LUMBAR LAMINECTOMY/DECOMPRESSION MICRODISCECTOMY  10/23/2011   Procedure: LUMBAR LAMINECTOMY/DECOMPRESSION MICRODISCECTOMY 1 LEVEL;  Surgeon: Temple Pacini, MD;  Location: [facility];  Service: Neurosurgery;  Laterality: Left;  Left Lumbar Four-Five Microdiskectomy/Laminectomy  . TONSILLECTOMY      Current Medications: Current Meds  Medication Sig  . aspirin EC 81 MG tablet Take 81 mg by mouth every morning. HOLD FOR PROCEDURE  . atorvastatin (LIPITOR) 80 MG tablet Take 1 tablet (80 mg total) by mouth daily.  . Calcium Carbonate-Vitamin D (CALCIUM + D PO) Take 1 tablet by mouth daily.   Marland Kitchen levothyroxine (SYNTHROID, LEVOTHROID) 50 MCG tablet Take 50 mcg by mouth at bedtime.  Marland Kitchen lisinopril (PRINIVIL,ZESTRIL) 5 MG tablet Take 5 mg by mouth daily.  . Multiple Vitamin (MULITIVITAMIN WITH MINERALS) TABS Take 1 tablet by mouth every morning.  . pantoprazole (PROTONIX) 40 MG tablet Take 40 mg by mouth 2 (two) times daily before a meal.  . vitamin B-12 (CYANOCOBALAMIN) 1000 MCG tablet  Take 1,000 mcg by mouth daily.      Allergies:   Amoxicillin   Social History   Socioeconomic History  . Marital status: Widowed    Spouse name: Not on file  . Number of children: Not on file  . Years of education: Not on file  . Highest education level: Not on file  Occupational History  . Not on file  Social Needs  . Financial resource strain: Not on file  . Food insecurity    Worry: Not on file    Inability: Not on file  . Transportation needs    Medical: Not on file    Non-medical: Not on file  Tobacco Use  . Smoking status: Never Smoker  . Smokeless tobacco: Never Used  Substance and Sexual Activity   . Alcohol use: No  . Drug use: No  . Sexual activity: Not Currently    Birth control/protection: Post-menopausal  Lifestyle  . Physical activity    Days per week: Not on file    Minutes per session: Not on file  . Stress: Not on file  Relationships  . Social Musician on phone: Not on file    Gets together: Not on file    Attends religious service: Not on file    Active member of club or organization: Not on file    Attends meetings of clubs or organizations: Not on file    Relationship status: Not on file  Other Topics Concern  . Not on file  Social History Narrative  . Not on file     Family History:  The patient's family history includes High Cholesterol in her sister.  ROS:   Please see the history of present illness. Otherwise, review of systems is positive for chronic back pain.  All other systems are reviewed and otherwise negative.    EKGs/Labs/Other Studies Reviewed:    Studies reviewed were summarized above.   EKG:  Patient declined to have EKG despite discussing the rationale.  Recent Labs: No results found for requested labs within last 8760 hours.  Recent Lipid Panel    Component Value Date/Time   CHOL 140 05/27/2014 0845   TRIG 67.0 05/27/2014 0845   HDL 52.20 05/27/2014 0845   CHOLHDL 3 05/27/2014 0845   VLDL 13.4 05/27/2014 0845   LDLCALC 74 05/27/2014 0845    PHYSICAL EXAM:    VS:  BP 140/60   Pulse 76   Ht 5\' 4"  (1.626 m)   Wt 151 lb (68.5 kg)   SpO2 98%   BMI 25.92 kg/m   BMI: Body mass index is 25.92 kg/m.  GEN: Well nourished, well developed WF, in no acute distress HEENT: normocephalic, atraumatic Neck: no JVD, carotid bruits, or masses Cardiac: RRR; no murmurs, rubs, or gallops, no edema  Respiratory:  clear to auscultation bilaterally, normal work of breathing GI: soft, nontender, nondistended, + BS MS: kyphotic posture Skin: warm and dry, no rash Neuro:  Alert and Oriented x 3, Strength and sensation are intact,  follows commands Psych: euthymic mood, full affect  Wt Readings from Last 3 Encounters:  05/08/19 151 lb (68.5 kg)  06/26/18 157 lb (71.2 kg)  06/13/18 157 lb 3.2 oz (71.3 kg)     ASSESSMENT & PLAN:   1. CAD - clinically stable. Continue ASA, statin. She believes her BP is a little up for her because of back pain (had a steroid injection yesterday). She follows this at home and indicates it's well controlled.  She refused EKG today. 2. Mild AS/AI - no significant murmurs on exam to suggest clinical worsening. 3. Mild pulmonary HTN - she has mild chronic dyspnea but she reports generally stable - has been a hard year to compare to given the pandemic but she felt good doing her water aerobics this past week. I discussed Dr. Theodosia Blender prior recommendation to repeat echocardiogram and she indicates she does not want to proceed, that emotionally she's not ready for another test right now. 4. Mildly dilated ascending aorta - she would like to defer echocardiogram at this time.   Disposition: F/u with Dr. Radford Pax in 1 year.  Medication Adjustments/Labs and Tests Ordered: Current medicines are reviewed at length with the patient today.  Concerns regarding medicines are outlined above. Medication changes, Labs and Tests ordered today are summarized above and listed in the Patient Instructions accessible in Encounters.   Signed, Charlie Pitter, PA-C  05/08/2019 12:06 PM    [facility] [address], [place], [place]  [address] Phone: [phone]; Fax: [phone]

## 2019-05-08 ENCOUNTER — Ambulatory Visit (INDEPENDENT_AMBULATORY_CARE_PROVIDER_SITE_OTHER): Payer: Medicare Other | Admitting: Physician Assistant

## 2019-05-08 ENCOUNTER — Encounter: Payer: Self-pay | Admitting: Physician Assistant

## 2019-05-08 ENCOUNTER — Encounter (INDEPENDENT_AMBULATORY_CARE_PROVIDER_SITE_OTHER): Payer: Self-pay

## 2019-05-08 ENCOUNTER — Other Ambulatory Visit: Payer: Self-pay

## 2019-05-08 VITALS — BP 140/60 | HR 76 | Ht 64.0 in | Wt 151.0 lb

## 2019-05-08 DIAGNOSIS — I2510 Atherosclerotic heart disease of native coronary artery without angina pectoris: Secondary | ICD-10-CM | POA: Diagnosis not present

## 2019-05-08 DIAGNOSIS — I350 Nonrheumatic aortic (valve) stenosis: Secondary | ICD-10-CM | POA: Diagnosis not present

## 2019-05-08 DIAGNOSIS — I351 Nonrheumatic aortic (valve) insufficiency: Secondary | ICD-10-CM | POA: Diagnosis not present

## 2019-05-08 DIAGNOSIS — I272 Pulmonary hypertension, unspecified: Secondary | ICD-10-CM | POA: Diagnosis not present

## 2019-05-08 DIAGNOSIS — I77810 Thoracic aortic ectasia: Secondary | ICD-10-CM

## 2019-05-08 NOTE — Patient Instructions (Addendum)
.  Medication Instructions:  Your physician recommends that you continue on your current medications as directed. Please refer to the Current Medication list given to you today.  If you need a refill on your cardiac medications before your next appointment, please call your pharmacy.   Lab work: None ordered  If you have labs (blood work) drawn today and your tests are completely normal, you will receive your results only by: Marland Kitchen MyChart Message (if you have MyChart) OR . A paper copy in the mail If you have any lab test that is abnormal or we need to change your treatment, we will call you to review the results.  Testing/Procedures: None ordered  Follow-Up: At [facility], you and your health needs are our priority.  As part of our continuing mission to provide you with exceptional heart care, we have created designated Provider Care Teams.  These Care Teams include your primary Cardiologist (physician) and Advanced Practice Providers (APPs -  Physician Assistants and Nurse Practitioners) who all work together to provide you with the care you need, when you need it. You will need a follow up appointment in 12 months.  Please call our office 2 months in advance to schedule this appointment.  You may see Fransico Him, MD or one of the following Advanced Practice Providers on your designated Care Team:   Sargeant, PA-C Melina Copa, PA-C . Ermalinda Barrios, PA-C  Any Other Special Instructions Will Be Listed Below (If Applicable).  Your heart ultrasound in October of 2018 showed mildly elevated pressures in the lungs (pulmonary hypertension), mildly dilated aorta (the blood vessel that comes out of your heart to the rest of your body), and mild narrowing/leaking of your aortic valve. Dr. Radford Pax had recommended a follow-up echocardiogram in October of 2019 but it does not look like you had this done. Please let us know if you want to consider this in the future.cavs

## 2019-06-03 ENCOUNTER — Telehealth: Payer: Self-pay | Admitting: Cardiology

## 2019-06-03 NOTE — Telephone Encounter (Signed)
Pt states she just spoke back with her orthopedic MD and she was instructed to resume back ASA until they schedule her injection for her back.  Pt states they told her that they anticipate scheduling this sometime soon.  They instructed her to resume this back and when they call her with the scheduled date, she will need to hold her ASA 7 days prior to the injection. Pt states they will also be sending a clearance note to our office to review and advise on, once they have this scheduled.  Pt states its Dr. Maxie Better with [facility] who will be doing her injection.  Will send this message to Dr. Radford Pax as a general FYI.

## 2019-06-03 NOTE — Telephone Encounter (Signed)
Patient has question about her baby aspirin she takes, she was told about her ortho doctor to stop taking it for 5 days prior to her injection in her back.  However it has not been scheduled it.  She stopped taking it on Friday, October 16.  She wants to know she she continue not taking it or should she start taking it again.

## 2019-06-04 ENCOUNTER — Telehealth: Payer: Self-pay | Admitting: *Deleted

## 2019-06-04 ENCOUNTER — Other Ambulatory Visit: Payer: Self-pay | Admitting: Specialist

## 2019-06-04 ENCOUNTER — Telehealth: Payer: Self-pay | Admitting: Cardiology

## 2019-06-04 DIAGNOSIS — G8929 Other chronic pain: Secondary | ICD-10-CM

## 2019-06-04 DIAGNOSIS — M545 Low back pain, unspecified: Secondary | ICD-10-CM

## 2019-06-04 NOTE — Telephone Encounter (Signed)
     Primary Cardiologist: Fransico Him, MD  Chart reviewed as part of pre-operative protocol coverage. Given past medical history and time since last visit, based on ACC/AHA guidelines, Kimberly [name] would be at acceptable risk for the planned procedure without further cardiovascular testing.   She has a hx of CAD s/p remote CABG in 2007, mildly dilated ascending aorta, mild AS/AI, dyslipidemia (followed by PCP), HTN, arthritis, GERD, hiatal hernia, hypothyroidism, pulmonary HTN who is here for follow-up. Last echo 05/2017 showed mild LVH, EF 60-65%, grade 1 DD, mild AS/AI, mildly dilated ascending aorta, mild increase PASP 14mmHg. Nuclear stress test 06/2018 was normal, EF 62%.  She was last seen by [facility] 05/08/2019 and was stable from a cardiac perspective. Per Dr. Radford Pax, it will be acceptable to hold ASA prior to procedure then resume thereafter when ok from a surgical standpoint.   I will route this recommendation to the requesting party via Epic fax function and remove from pre-op pool.  Please call with questions.  Kathyrn Drown, NP 06/04/2019, 3:40 PM

## 2019-06-04 NOTE — Telephone Encounter (Signed)
She is fine to be off ASA for procedure

## 2019-06-04 NOTE — Telephone Encounter (Signed)
   [facility] Pre-operative Risk Assessment    Request for surgical clearance:  1. What type of surgery is being performed? EPIDURAL SPINAL INJECTION   2. When is this surgery scheduled? 06/14/19   3. What type of clearance is required (medical clearance vs. Pharmacy clearance to hold med vs. Both)? MEDICAL  4. Are there any medications that need to be held prior to surgery and how long? ASA X 5 DAYS PRIOR   5. Practice name and name of physician performing surgery? [facility]; DR. JEFFREY BEANE   6. What is your office phone number [phone] EXT [phone]   7.   What is your office fax number [phone]  8.   Anesthesia type (None, local, MAC, general) ? NOT LISTED; LOCAL?    Julaine Hua 06/04/2019, 8:11 AM  _________________________________________________________________   (provider comments below)

## 2019-06-16 ENCOUNTER — Telehealth: Payer: Self-pay | Admitting: Cardiology

## 2019-06-16 NOTE — Telephone Encounter (Signed)
I do not see any cardiac reason why she could not have surgery but the type of surgery and her age may be what he is concerned about.  She needs to talk with her PCP

## 2019-06-16 NOTE — Telephone Encounter (Signed)
New Message  Patient is calling in to get the opinion of Dr Radford Pax about her trying to get back surgery. Patient is being seen by Dr. Maxie Better for her back and told patient that she is too old to have back surgery. Patient wants to discuss with Dr. Radford Pax. Please give patient a call back to discuss.

## 2019-06-16 NOTE — Telephone Encounter (Signed)
I spoke to the patient with Dr Theodosia Blender recommendation on her back surgery.  The patient will reach out to her PCP for further advisement.  She verbalized understanding.

## 2019-06-16 NOTE — Telephone Encounter (Signed)
lpmtcb 11/2 

## 2019-06-16 NOTE — Telephone Encounter (Signed)
I spoke with pt. She reports having cortisone shots in the past which did not help her back pain. Had epidural injection this past Saturday. This was very painful for pt. So far she has gotten little relief from injection. She states doctor doing injection told her she needed back surgery. Pt reports Dr Tonita Cong has previously told her she is too old for back surgery. Pt is seeing Dr Tonita Cong this Friday.  Pt would like to know if Dr Radford Pax feels from a cardiology standpoint she could have surgery.  Pt states she is not sure she wants surgery but would like to know if Dr Radford Pax feels this may be an option.

## 2019-11-18 ENCOUNTER — Telehealth: Payer: Self-pay | Admitting: Cardiology

## 2019-11-18 NOTE — Telephone Encounter (Signed)
New Message:     Pt would like  For you to call today after 4:00. She says she have some concerns she wants to discuss. She is not sure if she needs to be seen by Dr Mayford Knife. Pt says he not want to see an APP.

## 2019-11-18 NOTE — Telephone Encounter (Signed)
Left message for patient call back

## 2019-11-19 NOTE — Telephone Encounter (Signed)
Patient returning call. Patient states she would like to be called at 4:30 to be sure she is at home.

## 2019-11-20 NOTE — Telephone Encounter (Signed)
Follow Up ° °Pt called back  °

## 2019-11-20 NOTE — Telephone Encounter (Signed)
Spoke with the patient who states that she has been having some left arm soreness every since she received both COVID vaccines. I advised her that it was normal for her arm to be sore after this. Patient also states that she has had some increased SOB and fatigue. She also reports having some occasional chest tightness that does not last very long. She states that her BP has been fluctuating but her systolic is usually around 135. She denies any other symptoms but feels like she needs to be seen by Dr. Mayford Knife. When she saw Ronie Spies, PA-C in 04/2019 she had denied having any follow up testing done but is not considering to proceed. I have made the patient an appointment to see Dr. Mayford Knife next week 04/15.

## 2019-11-27 ENCOUNTER — Encounter: Payer: Self-pay | Admitting: Cardiology

## 2019-11-27 ENCOUNTER — Other Ambulatory Visit: Payer: Self-pay

## 2019-11-27 ENCOUNTER — Ambulatory Visit (HOSPITAL_COMMUNITY): Payer: Medicare PPO | Attending: Internal Medicine

## 2019-11-27 ENCOUNTER — Encounter (HOSPITAL_COMMUNITY): Payer: Self-pay | Admitting: *Deleted

## 2019-11-27 ENCOUNTER — Ambulatory Visit: Payer: Medicare PPO | Admitting: Cardiology

## 2019-11-27 VITALS — BP 132/64 | HR 67 | Ht 64.0 in | Wt 152.4 lb

## 2019-11-27 DIAGNOSIS — I350 Nonrheumatic aortic (valve) stenosis: Secondary | ICD-10-CM | POA: Insufficient documentation

## 2019-11-27 DIAGNOSIS — E785 Hyperlipidemia, unspecified: Secondary | ICD-10-CM

## 2019-11-27 DIAGNOSIS — I2510 Atherosclerotic heart disease of native coronary artery without angina pectoris: Secondary | ICD-10-CM | POA: Insufficient documentation

## 2019-11-27 DIAGNOSIS — I272 Pulmonary hypertension, unspecified: Secondary | ICD-10-CM

## 2019-11-27 DIAGNOSIS — I2583 Coronary atherosclerosis due to lipid rich plaque: Secondary | ICD-10-CM

## 2019-11-27 DIAGNOSIS — I10 Essential (primary) hypertension: Secondary | ICD-10-CM

## 2019-11-27 DIAGNOSIS — I77810 Thoracic aortic ectasia: Secondary | ICD-10-CM | POA: Diagnosis not present

## 2019-11-27 LAB — ECHOCARDIOGRAM COMPLETE
Height: 64 in
Weight: 2438.4 oz

## 2019-11-27 NOTE — Patient Instructions (Addendum)
Medication Instructions:  Your physician recommends that you continue on your current medications as directed. Please refer to the Current Medication list given to you today.  *If you need a refill on your cardiac medications before your next appointment, please call your pharmacy*   Lab Work: Fasting lipids and CMET on same day as stress test.   If you have labs (blood work) drawn today and your tests are completely normal, you will receive your results only by: Marland Kitchen MyChart Message (if you have MyChart) OR . A paper copy in the mail If you have any lab test that is abnormal or we need to change your treatment, we will call you to review the results.   Testing/Procedures: Your physician has requested that you have an echocardiogram. Echocardiography is a painless test that uses sound waves to create images of your heart. It provides your doctor with information about the size and shape of your heart and how well your heart's chambers and valves are working. This procedure takes approximately one hour. There are no restrictions for this procedure.  Your physician has requested that you have a lexiscan myoview. For further information please visit [email or web address] Please follow instruction sheet, as given.   Follow-Up: At [facility], you and your health needs are our priority.  As part of our continuing mission to provide you with exceptional heart care, we have created designated Provider Care Teams.  These Care Teams include your primary Cardiologist (physician) and Advanced Practice Providers (APPs -  Physician Assistants and Nurse Practitioners) who all work together to provide you with the care you need, when you need it.  We recommend signing up for the patient portal called "MyChart".  Sign up information is provided on this After Visit Summary.  MyChart is used to connect with patients for Virtual Visits (Telemedicine).  Patients are able to view lab/test results, encounter notes,  upcoming appointments, etc.  Non-urgent messages can be sent to your provider as well.   To learn more about what you can do with MyChart, go to [email or web address].    Your next appointment:   6 month(s)  The format for your next appointment:   In Person  Provider:   You may see Armanda Magic, MD or one of the following Advanced Practice Providers on your designated Care Team:    Ronie Spies, PA-C  Jacolyn Reedy, PA-C

## 2019-11-27 NOTE — Progress Notes (Signed)
Cardiology Office Note:    Date:  11/27/2019   ID:  DARRIANA DEBOY, DOB [birth year], MRN [id number]  PCP:  Catha Gosselin, MD  Cardiologist:  Armanda Magic, MD    Referring MD: Catha Gosselin, MD   Chief Complaint  Patient presents with  . Coronary Artery Disease  . Hypertension  . Hyperlipidemia    History of Present Illness:    Kimberly [name] is a 86 y.o. female with a hx of CAD s/p remote CABG in 2007, mildly dilated ascending aorta, mild AS/AI, dyslipidemia (followed by PCP), HTN, arthritis, GERD, hiatal hernia, hypothyroidism, pulmonary HTN. Last echo 05/2017 showed mild LVH, EF 60-65%, grade 1 DD, mild AS/AI, mildly dilated ascending aorta, mild increase PASP . Nuclear stress test 06/2018 was normal, EF 62%.   She is here today for evaluation of chest pain.  She recently started noticing episodes of chest heaviness and pressure with exertion but also sometimes nonexertional.  It is mid sternal with no real radiation but is associated with SOB.  There is no diaphoresis or nausea. She recently got out of her car and waked a very short distance and developed significant chest pressure and had to stop.  She became alarmed and decided she needed to get it checked out.  She does have a hx of AS and was supposed to have an echo a year ago but declined.  She denies any  PND, orthopnea, LE edema, dizziness, palpitations or syncope. She is compliant with her meds and is tolerating meds with no SE.    Past Medical History:  Diagnosis Date  . Aortic regurgitation 04/30/2014   mild by echo 05/2017  . Aortic stenosis    mild by echo 2018  . Arthritis   . Benign essential HTN   . Complication of anesthesia    SLOW TO WAKE UP  . Coronary artery disease    S/P CABG 2007  . Dyslipidemia   . GERD (gastroesophageal reflux disease)   . H/O hiatal hernia    w distal esophageal ulceration  . Hypertension   . Hypothyroidism   . Mild dilation of ascending aorta (HCC)   . Neuromuscular  disorder (HCC)   . Osteoporosis   . Pulmonary HTN (HCC)    mild to moderate with PASP by echo 05/2017  . Vertigo     Past Surgical History:  Procedure Laterality Date  . BACK SURGERY  28 YRS AGO  . CARDIAC CATHETERIZATION    . CORONARY ARTERY BYPASS GRAFT  2007  . CORONARY ARTERY BYPASS GRAFT  12/2005  . ESOPHAGOGASTRODUODENOSCOPY     Multiple  . FLEXIBLE SIGMOIDOSCOPY  09/1998  . LAMINECTOMY  10/23/2011  . LUMBAR LAMINECTOMY/DECOMPRESSION MICRODISCECTOMY  10/23/2011   Procedure: LUMBAR LAMINECTOMY/DECOMPRESSION MICRODISCECTOMY 1 LEVEL;  Surgeon: Temple Pacini, MD;  Location: [facility];  Service: Neurosurgery;  Laterality: Left;  Left Lumbar Four-Five Microdiskectomy/Laminectomy  . TONSILLECTOMY      Current Medications: Current Meds  Medication Sig  . aspirin EC 81 MG tablet Take 81 mg by mouth every morning. HOLD FOR PROCEDURE  . atorvastatin (LIPITOR) 80 MG tablet Take 1 tablet (80 mg total) by mouth daily.  . Calcium Carbonate-Vitamin D (CALCIUM + D PO) Take 1 tablet by mouth daily.   Marland Kitchen levothyroxine (SYNTHROID, LEVOTHROID) 50 MCG tablet Take 50 mcg by mouth at bedtime.  Marland Kitchen lisinopril (PRINIVIL,ZESTRIL) 5 MG tablet Take 5 mg by mouth daily.  . Multiple Vitamin (MULITIVITAMIN WITH MINERALS) TABS Take 1 tablet by  mouth every morning.  . pantoprazole (PROTONIX) 40 MG tablet Take 40 mg by mouth 2 (two) times daily before a meal.  . vitamin B-12 (CYANOCOBALAMIN) 1000 MCG tablet Take 1,000 mcg by mouth daily.     Allergies:   Amoxicillin   Social History   Socioeconomic History  . Marital status: Widowed    Spouse name: Not on file  . Number of children: Not on file  . Years of education: Not on file  . Highest education level: Not on file  Occupational History  . Not on file  Tobacco Use  . Smoking status: Never Smoker  . Smokeless tobacco: Never Used  Substance and Sexual Activity  . Alcohol use: No  . Drug use: No  . Sexual activity: Not Currently    Birth  control/protection: Post-menopausal  Other Topics Concern  . Not on file  Social History Narrative  . Not on file   Social Determinants of Health   Financial Resource Strain:   . Difficulty of Paying Living Expenses:   Food Insecurity:   . Worried About Programme researcher, broadcasting/film/video in the Last Year:   . Barista in the Last Year:   Transportation Needs:   . Freight forwarder (Medical):   Marland Kitchen Lack of Transportation (Non-Medical):   Physical Activity:   . Days of Exercise per Week:   . Minutes of Exercise per Session:   Stress:   . Feeling of Stress :   Social Connections:   . Frequency of Communication with Friends and Family:   . Frequency of Social Gatherings with Friends and Family:   . Attends Religious Services:   . Active Member of Clubs or Organizations:   . Attends Banker Meetings:   Marland Kitchen Marital Status:      Family History: The patient's family history includes High Cholesterol in her sister.  ROS:   Please see the history of present illness.    ROS  All other systems reviewed and negative.   EKGs/Labs/Other Studies Reviewed:   The following studies were reviewed today: none  EKG:  EKG is  ordered today.  The ekg ordered today demonstrates NSR with anterolateral infarct, LVH by voltage with repol abnormality   Recent Labs: No results found for requested labs within last 8760 hours.   Recent Lipid Panel    Component Value Date/Time   CHOL 140 05/27/2014 0845   TRIG 67.0 05/27/2014 0845   HDL 52.20 05/27/2014 0845   CHOLHDL 3 05/27/2014 0845   VLDL 13.4 05/27/2014 0845   LDLCALC 74 05/27/2014 0845    Physical Exam:    VS:  BP 132/64   Pulse 67   Ht 5\' 4"  (1.626 m)   Wt 152 lb 6.4 oz (69.1 kg)   SpO2 98%   BMI 26.16 kg/m     Wt Readings from Last 3 Encounters:  11/27/19 152 lb 6.4 oz (69.1 kg)  05/08/19 151 lb (68.5 kg)  06/26/18 157 lb (71.2 kg)     GEN:  Well nourished, well developed in no acute distress HEENT:  Normal NECK: No JVD; No carotid bruits LYMPHATICS: No lymphadenopathy CARDIAC: RRR, no rubs, gallops.  2/6 SM at RUSB to LLSB RESPIRATORY:  Clear to auscultation without rales, wheezing or rhonchi  ABDOMEN: Soft, non-tender, non-distended MUSCULOSKELETAL:  No edema; No deformity  SKIN: Warm and dry NEUROLOGIC:  Alert and oriented x 3 PSYCHIATRIC:  Normal affect   ASSESSMENT:    1. Coronary artery disease  due to lipid rich plaque   2. Nonrheumatic aortic valve stenosis   3. Pulmonary HTN ([place])   4. Mild dilation of ascending aorta (HCC)   5. Benign essential HTN   6. Dyslipidemia    PLAN:    In order of problems listed above:  1.  ASCAD -s/p remote CABG -she is now having intermittent exertional angina that is concerning -I will get a lexiscan myoview to rule out ischemia -she also has AS so I think we need to repeat 2D echo to assess for progression of AS -Continue ASA and statin  2.  Aortic stenosis -mild AS and mild AR by echo 2018 -repeat echo  3.  Pulmonary HTN -very mild with PASP 31mmHg by echo 2018 -repeat 2D echo  4.  Dilated ascending aorta -84mm by echo 2018 -repeat 2D echo to reassess  5.  HTN -BP controlled -continue Lisinopril 5mg  daily -check BMET  6.  HLD -LDL goal < 70 -LDL was 67 in July 2020 -continue Atorva 80mg  daily -check FLP and ALT   Medication Adjustments/Labs and Tests Ordered: Current medicines are reviewed at length with the patient today.  Concerns regarding medicines are outlined above.  Orders Placed This Encounter  Procedures  . EKG 12-Lead   No orders of the defined types were placed in this encounter.   Signed, Fransico Him, MD  11/27/2019 9:52 AM    [facility]

## 2019-11-28 ENCOUNTER — Ambulatory Visit (HOSPITAL_COMMUNITY): Payer: Medicare PPO | Attending: Internal Medicine

## 2019-11-28 ENCOUNTER — Telehealth: Payer: Self-pay | Admitting: Cardiology

## 2019-11-28 ENCOUNTER — Other Ambulatory Visit: Payer: Medicare PPO | Admitting: *Deleted

## 2019-11-28 VITALS — Ht 64.0 in | Wt 152.0 lb

## 2019-11-28 DIAGNOSIS — I77810 Thoracic aortic ectasia: Secondary | ICD-10-CM

## 2019-11-28 DIAGNOSIS — I10 Essential (primary) hypertension: Secondary | ICD-10-CM

## 2019-11-28 DIAGNOSIS — I272 Pulmonary hypertension, unspecified: Secondary | ICD-10-CM | POA: Diagnosis present

## 2019-11-28 DIAGNOSIS — E785 Hyperlipidemia, unspecified: Secondary | ICD-10-CM | POA: Diagnosis present

## 2019-11-28 DIAGNOSIS — I350 Nonrheumatic aortic (valve) stenosis: Secondary | ICD-10-CM

## 2019-11-28 DIAGNOSIS — I2583 Coronary atherosclerosis due to lipid rich plaque: Secondary | ICD-10-CM | POA: Diagnosis present

## 2019-11-28 DIAGNOSIS — I2510 Atherosclerotic heart disease of native coronary artery without angina pectoris: Secondary | ICD-10-CM | POA: Diagnosis not present

## 2019-11-28 DIAGNOSIS — R0602 Shortness of breath: Secondary | ICD-10-CM | POA: Diagnosis present

## 2019-11-28 DIAGNOSIS — R110 Nausea: Secondary | ICD-10-CM | POA: Insufficient documentation

## 2019-11-28 LAB — COMPREHENSIVE METABOLIC PANEL
ALT: 17 IU/L (ref 0–32)
AST: 18 IU/L (ref 0–40)
Albumin/Globulin Ratio: 2.3 — ABNORMAL HIGH (ref 1.2–2.2)
Albumin: 4.1 g/dL (ref 3.6–4.6)
Alkaline Phosphatase: 46 IU/L (ref 39–117)
BUN/Creatinine Ratio: 13 (ref 12–28)
BUN: 13 mg/dL (ref 8–27)
Bilirubin Total: 0.4 mg/dL (ref 0.0–1.2)
CO2: 25 mmol/L (ref 20–29)
Calcium: 9.7 mg/dL (ref 8.7–10.3)
Chloride: 106 mmol/L (ref 96–106)
Creatinine, Ser: 1.03 mg/dL — ABNORMAL HIGH (ref 0.57–1.00)
GFR calc Af Amer: 57 mL/min/{1.73_m2} — ABNORMAL LOW (ref >59)
GFR calc non Af Amer: 49 mL/min/{1.73_m2} — ABNORMAL LOW (ref >59)
Globulin, Total: 1.8 g/dL (ref 1.5–4.5)
Glucose: 90 mg/dL (ref 65–99)
Potassium: 4.5 mmol/L (ref 3.5–5.2)
Sodium: 143 mmol/L (ref 134–144)
Total Protein: 5.9 g/dL — ABNORMAL LOW (ref 6.0–8.5)

## 2019-11-28 LAB — MYOCARDIAL PERFUSION IMAGING
LV dias vol: 83 mL (ref 46–106)
LV sys vol: 25 mL
Peak HR: 88 {beats}/min
Rest HR: 52 {beats}/min
SDS: 3
SRS: 5
SSS: 8
TID: 1.12

## 2019-11-28 LAB — LIPID PANEL
Chol/HDL Ratio: 1.9 ratio (ref 0.0–4.4)
Cholesterol, Total: 128 mg/dL (ref 100–199)
HDL: 68 mg/dL (ref >39)
LDL Chol Calc (NIH): 49 mg/dL (ref 0–99)
Triglycerides: 45 mg/dL (ref 0–149)
VLDL Cholesterol Cal: 11 mg/dL (ref 5–40)

## 2019-11-28 MED ORDER — TECHNETIUM TC 99M TETROFOSMIN IV KIT
32.5000 | PACK | Freq: Once | INTRAVENOUS | Status: AC | PRN
  Administered 2019-11-28: 32.5 via INTRAVENOUS
  Filled 2019-11-28: qty 33

## 2019-11-28 MED ORDER — TECHNETIUM TC 99M TETROFOSMIN IV KIT
11.0000 | PACK | Freq: Once | INTRAVENOUS | Status: AC | PRN
  Administered 2019-11-28: 11 via INTRAVENOUS
  Filled 2019-11-28: qty 11

## 2019-11-28 MED ORDER — AMINOPHYLLINE 25 MG/ML IV SOLN
150.0000 mg | Freq: Once | INTRAVENOUS | Status: AC
  Administered 2019-11-28: 150 mg via INTRAVENOUS

## 2019-11-28 MED ORDER — REGADENOSON 0.4 MG/5ML IV SOLN
0.4000 mg | Freq: Once | INTRAVENOUS | Status: AC
Start: 2019-11-28 — End: 2019-11-28
  Administered 2019-11-28: 0.4 mg via INTRAVENOUS

## 2019-11-28 NOTE — Telephone Encounter (Signed)
New Message  Received call from the patient about results.  Please call back.

## 2019-11-28 NOTE — Telephone Encounter (Signed)
Kimberly Reichert, MD  Theresia Majors, RN  Echo showed normal heart function wit increased stiffness of heart muscle, stable mild pulmonary HTN, mildly enlarged LA, mildly leaky MV, moderately leaky AV. Compared to prior study the AV leakiness has increased - repeat echo in 1 year for AR. Aortic root measurement was normal at 67mm.   The patient has been notified of the result and verbalized understanding.  All questions (if any) were answered. Theresia Majors, RN 11/28/2019 1:36 PM

## 2019-12-01 ENCOUNTER — Telehealth: Payer: Self-pay | Admitting: Cardiology

## 2019-12-01 DIAGNOSIS — R0602 Shortness of breath: Secondary | ICD-10-CM

## 2019-12-01 NOTE — Telephone Encounter (Signed)
Quintella Reichert, MD  Theresia Majors, RN  Refer to Pulmonary for evaluation of SOB    The patient has been notified of the result and verbalized understanding.  All questions (if any) were answered. Theresia Majors, RN 12/01/2019 3:02 PM

## 2019-12-01 NOTE — Telephone Encounter (Signed)
Patient returning call for stress test results. 

## 2019-12-24 ENCOUNTER — Institutional Professional Consult (permissible substitution): Payer: Medicare PPO | Admitting: Critical Care Medicine

## 2020-01-14 ENCOUNTER — Ambulatory Visit: Payer: Medicare PPO | Admitting: Critical Care Medicine

## 2020-01-14 ENCOUNTER — Other Ambulatory Visit: Payer: Self-pay

## 2020-01-14 ENCOUNTER — Encounter: Payer: Self-pay | Admitting: Critical Care Medicine

## 2020-01-14 ENCOUNTER — Ambulatory Visit (INDEPENDENT_AMBULATORY_CARE_PROVIDER_SITE_OTHER): Payer: Medicare PPO

## 2020-01-14 VITALS — BP 128/62 | HR 54 | Temp 97.7°F | Ht 64.0 in | Wt 153.0 lb

## 2020-01-14 DIAGNOSIS — R0600 Dyspnea, unspecified: Secondary | ICD-10-CM

## 2020-01-14 DIAGNOSIS — K219 Gastro-esophageal reflux disease without esophagitis: Secondary | ICD-10-CM

## 2020-01-14 DIAGNOSIS — R0609 Other forms of dyspnea: Secondary | ICD-10-CM

## 2020-01-14 DIAGNOSIS — K449 Diaphragmatic hernia without obstruction or gangrene: Secondary | ICD-10-CM | POA: Diagnosis not present

## 2020-01-14 NOTE — Patient Instructions (Addendum)
Thank you for visiting Dr. Chestine Spore at [facility]. We recommend the following: Orders Placed This Encounter  Procedures  . DG Chest 2 View   Orders Placed This Encounter  Procedures  . DG Chest 2 View    Standing Status:   Future    Number of Occurrences:   1    Standing Expiration Date:   01/13/2021    Order Specific Question:   Reason for Exam (SYMPTOM  OR DIAGNOSIS REQUIRED)    Answer:   DOE    Order Specific Question:   Preferred imaging location?    Answer:   Internal    Order Specific Question:   Radiology Contrast Protocol - do NOT remove file path    Answer:   [email or web address]   GERD- likely your chest pain and breathing episodes are related to reflux.  Recommend starting pantoprazole 40mg  once daily (take once daily before breakfast).  -Make sure you talk to Dr. about this.     Return in about 4 weeks (around 02/11/2020).      Please do your part to reduce the spread of COVID-19.

## 2020-01-14 NOTE — Progress Notes (Signed)
Synopsis: Referred in April 2021 for SOB by Kimberly Reichert, MD.  Subjective:   PATIENT ID: Kimberly [name] GENDER: female DOB: October 13, 1930, MRN: [id number]  Chief Complaint  Patient presents with  . Consult    no cough/ wheezing/ SOB     Kimberly [name] is an 86 year old woman with a history of HFpEF, coronary artery disease status post four-vessel CABG, mild AS and AI, mild PAH, and a chronic hiatal hernia who presents for evaluation of episodic chest pressure with associated shortness of breath.  This happens both exertionally and not exertionally, at random times, and only lasts for 1 to 2 minutes before resolving.  It does not happen every day.  She assumed this was due to her heart rate began about 2 months ago, but was evaluated by her cardiologist and was deemed to have noncardiac source of her pain as her work-up was stable (Reviewed Dr. Norris Cross 11/27/19 note).  When she gets these episodes she feels like she has to take deep breaths, then the pain will resolve.  She never has episodes of shortness of breath without this pain.  She denies coughing, wheezing, sputum production.  No edema, abdominal distention, or abdominal pain.  No heartburn, nausea, vomiting.  She never smoked.  There is no family history of lung disease.  She has had hoarseness for a few months and frequent throat clearing.  Prescribed Protonix twice daily in the past, but stopped taking this medication on her own.  Due to dietary modifications she does not have reflux symptoms on a regular basis.     Past Medical History:  Diagnosis Date  . Aortic regurgitation 04/30/2014   Moderate by echo 11/2019  . Aortic stenosis    mild by echo 2018 and normal gradient byecho 11/2019  . Arthritis   . Ascending aorta dilatation (HCC)    ascending aortic root was 4mm on echo 11/2019  . Benign essential HTN   . Complication of anesthesia    SLOW TO WAKE UP  . Coronary artery disease    S/P CABG 2007  . Dyslipidemia   . GERD  (gastroesophageal reflux disease)   . H/O hiatal hernia    w distal esophageal ulceration  . Hyperlipidemia   . Hypertension   . Hypothyroidism   . Neuromuscular disorder (HCC)   . Osteoporosis   . Pulmonary HTN (HCC)    mild at by echo 11/2019  . Vertigo      Family History  Problem Relation Age of Onset  . High Cholesterol Sister   . Heart disease Mother   . Cancer Father      Past Surgical History:  Procedure Laterality Date  . BACK SURGERY  28 YRS AGO  . CARDIAC CATHETERIZATION    . CORONARY ARTERY BYPASS GRAFT  2007  . CORONARY ARTERY BYPASS GRAFT  12/2005  . ESOPHAGOGASTRODUODENOSCOPY     Multiple  . FLEXIBLE SIGMOIDOSCOPY  09/1998  . LAMINECTOMY  10/23/2011  . LUMBAR LAMINECTOMY/DECOMPRESSION MICRODISCECTOMY  10/23/2011   Procedure: LUMBAR LAMINECTOMY/DECOMPRESSION MICRODISCECTOMY 1 LEVEL;  Surgeon: Temple Pacini, MD;  Location: [facility];  Service: Neurosurgery;  Laterality: Left;  Left Lumbar Four-Five Microdiskectomy/Laminectomy  . TONSILLECTOMY      Social History   Socioeconomic History  . Marital status: Widowed    Spouse name: Not on file  . Number of children: Not on file  . Years of education: Not on file  . Highest education level: Not on file  Occupational History  .  Not on file  Tobacco Use  . Smoking status: Never Smoker  . Smokeless tobacco: Never Used  Substance and Sexual Activity  . Alcohol use: No  . Drug use: No  . Sexual activity: Not Currently    Birth control/protection: Post-menopausal  Other Topics Concern  . Not on file  Social History Narrative  . Not on file   Social Determinants of Health   Financial Resource Strain:   . Difficulty of Paying Living Expenses:   Food Insecurity:   . Worried About Charity fundraiser in the Last Year:   . Arboriculturist in the Last Year:   Transportation Needs:   . Film/video editor (Medical):   Marland Kitchen Lack of Transportation (Non-Medical):   Physical Activity:   . Days of  Exercise per Week:   . Minutes of Exercise per Session:   Stress:   . Feeling of Stress :   Social Connections:   . Frequency of Communication with Friends and Family:   . Frequency of Social Gatherings with Friends and Family:   . Attends Religious Services:   . Active Member of Clubs or Organizations:   . Attends Archivist Meetings:   Marland Kitchen Marital Status:   Intimate Partner Violence:   . Fear of Current or Ex-Partner:   . Emotionally Abused:   Marland Kitchen Physically Abused:   . Sexually Abused:      Allergies  Allergen Reactions  . Amoxicillin Other (See Comments)    Reaction unknown per pt     Immunization History  Administered Date(s) Administered  . Influenza,inj,quad, With Preservative 05/14/2017  . Moderna SARS-COVID-2 Vaccination 08/27/2019, 09/24/2019    Outpatient Medications Prior to Visit  Medication Sig Dispense Refill  . aspirin EC 81 MG tablet Take 81 mg by mouth every morning. HOLD FOR PROCEDURE    . atorvastatin (LIPITOR) 80 MG tablet Take 1 tablet (80 mg total) by mouth daily. 30 tablet 0  . Calcium Carbonate-Vitamin D (CALCIUM + D PO) Take 1 tablet by mouth daily.     Marland Kitchen levothyroxine (SYNTHROID, LEVOTHROID) 50 MCG tablet Take 50 mcg by mouth at bedtime.    Marland Kitchen lisinopril (PRINIVIL,ZESTRIL) 5 MG tablet Take 5 mg by mouth daily.    . Multiple Vitamin (MULITIVITAMIN WITH MINERALS) TABS Take 1 tablet by mouth every morning.    . pantoprazole (PROTONIX) 40 MG tablet Take 40 mg by mouth 2 (two) times daily before a meal.    . vitamin B-12 (CYANOCOBALAMIN) 1000 MCG tablet Take 1,000 mcg by mouth daily.     No facility-administered medications prior to visit.    Review of Systems  Constitutional: Negative for chills and fever.  HENT: Negative for congestion.        No past nasal drip  Respiratory: Positive for shortness of breath. Negative for sputum production and wheezing.   Cardiovascular: Negative for chest pain and leg swelling.  Gastrointestinal:  Negative for heartburn, nausea and vomiting.  Musculoskeletal: Positive for back pain. Negative for myalgias.  Skin: Negative for rash.     Objective:   Vitals:   01/14/20 0936  BP: 128/62  Pulse: (!) 54  Temp: 97.7 F (36.5 C)  TempSrc: Oral  SpO2: 98%  Weight: 153 lb (69.4 kg)  Height: 5\' 4"  (1.626 m)   98% on   RA BMI Readings from Last 3 Encounters:  01/14/20 26.26 kg/m  11/28/19 26.09 kg/m  11/27/19 26.16 kg/m   Wt Readings from Last 3 Encounters:  01/14/20 153 lb (69.4 kg)  11/28/19 152 lb (68.9 kg)  11/27/19 152 lb 6.4 oz (69.1 kg)    Physical Exam Vitals reviewed.  Constitutional:      Appearance: She is not ill-appearing.  HENT:     Head: Normocephalic and atraumatic.  Eyes:     General: No scleral icterus. Cardiovascular:     Rate and Rhythm: Normal rate and regular rhythm.     Comments: Mild systolic left sternal border murmur Pulmonary:     Comments: Lungs CTAB, breathing comfortably on RA.  Gastric sounds heard posteriorly throughout the chest. Abdominal:     General: There is no distension.     Palpations: Abdomen is soft.     Tenderness: There is no abdominal tenderness.  Musculoskeletal:        General: No swelling or deformity.     Cervical back: Neck supple.     Comments: Kyphosis  Lymphadenopathy:     Cervical: No cervical adenopathy.  Skin:    General: Skin is warm and dry.     Findings: No rash.  Neurological:     General: No focal deficit present.     Mental Status: She is alert.     Coordination: Coordination normal.  Psychiatric:        Mood and Affect: Mood normal.        Behavior: Behavior normal.      CBC    Component Value Date/Time   WBC 6.5 10/20/2011 1014   RBC 5.04 10/20/2011 1014   HGB 14.4 10/20/2011 1014   HCT 43.6 10/20/2011 1014   PLT 190 10/20/2011 1014   MCV 86.5 10/20/2011 1014   MCH 28.6 10/20/2011 1014   MCHC 33.0 10/20/2011 1014   RDW 13.4 10/20/2011 1014   LYMPHSABS 1.8 10/20/2011 1014    MONOABS 0.6 10/20/2011 1014   EOSABS 0.2 10/20/2011 1014   BASOSABS 0.0 10/20/2011 1014    CHEMISTRY No results for input(s): NA, K, CL, CO2, GLUCOSE, BUN, CREATININE, CALCIUM, MG, PHOS in the last 168 hours. CrCl cannot be calculated (Patient's most recent lab result is older than the maximum 21 days allowed.).   Chest Imaging- films reviewed: CXR, 2 view 10/20/2011-significant kyphoscoliosis, possible hyperinflation with flattened hemidiaphragms.  Elevated left hemidiaphragm.  CXR, 2 view 01/14/2020-severe kyphosis, increased right middle lobe & medial right lower lobe markings.  Pulmonary Functions Testing Results: No flowsheet data found.    Echocardiogram 11/27/19: LVEF 60 to 65%, grade 2 diastolic dysfunction.  Mildly dilated LA, normal RV with mildly elevated PASP, Normal RA.  IVC normal in size with reduced respiratory variability.  Mild MR, moderate AR, mild to moderate aortic sclerosis without stenosis.  Heart Catheterization:  NM SPECT 11/28/2019-low risk study, nuclear stress EF 70%.  No evident ST segment changes to suggest ischemia; ST segment depression 0.5 mm in 1 and aVL during stress.  No arrhythmias.    Assessment & Plan:     ICD-10-CM   1. DOE (dyspnea on exertion)  R06.00 DG Chest 2 View    CANCELED: Pulmonary function test  2. Gastroesophageal reflux disease, unspecified whether esophagitis present  K21.9   3. Hiatal hernia  K44.9     Episodic chest pain with dyspnea, likely related to GERD with hiatal hernia.  Chest x-ray with chronic right middle lobe changes suggestive of chronic aspiration. -Recommend starting Protonix once daily 30 minutes before breakfast.  She was previously prescribed this medication twice daily, but has not been taking it.  Although I  attempted to prescribe it, she indicated that she would prefer her primary care doctor to be the one to do this.  She indicated that she hope to follow-up with him regarding this complaint and avoid having to  be seen by specialist again. -I am glad she has adopted lifestyle modifications that promote reflux control. -We will defer PFTs given her desire to avoid follow-up in the pulmonary office.  If her symptoms remain unresolved or there remains a question about her breathing, recommend full PFTs and I am happy to see and evaluate her again.  RTC as needed.    Current Outpatient Medications:  .  aspirin EC 81 MG tablet, Take 81 mg by mouth every morning. HOLD FOR PROCEDURE, Disp: , Rfl:  .  atorvastatin (LIPITOR) 80 MG tablet, Take 1 tablet (80 mg total) by mouth daily., Disp: 30 tablet, Rfl: 0 .  Calcium Carbonate-Vitamin D (CALCIUM + D PO), Take 1 tablet by mouth daily. , Disp: , Rfl:  .  levothyroxine (SYNTHROID, LEVOTHROID) 50 MCG tablet, Take 50 mcg by mouth at bedtime., Disp: , Rfl:  .  lisinopril (PRINIVIL,ZESTRIL) 5 MG tablet, Take 5 mg by mouth daily., Disp: , Rfl:  .  Multiple Vitamin (MULITIVITAMIN WITH MINERALS) TABS, Take 1 tablet by mouth every morning., Disp: , Rfl:  .  pantoprazole (PROTONIX) 40 MG tablet, Take 40 mg by mouth 2 (two) times daily before a meal., Disp: , Rfl:  .  vitamin B-12 (CYANOCOBALAMIN) 1000 MCG tablet, Take 1,000 mcg by mouth daily., Disp: , Rfl:     Steffanie Dunn, DO [facility] 01/14/2020 10:59 AM

## 2020-05-28 DIAGNOSIS — Z23 Encounter for immunization: Secondary | ICD-10-CM | POA: Diagnosis not present

## 2020-06-04 ENCOUNTER — Telehealth: Payer: Self-pay | Admitting: Cardiology

## 2020-06-04 NOTE — Telephone Encounter (Signed)
Pt wants to know if Dr. Mayford Knife still wants her to continue to take the baby aspirin. Wants Dr. Norris Cross opinion on the matter. Please call

## 2020-06-04 NOTE — Telephone Encounter (Signed)
She has CAD and needs to stay on ASA

## 2020-06-04 NOTE — Telephone Encounter (Signed)
Spoke with the patient's daughter and advised that the patient should continue ASA. The daughter states that she is unsure why the patient called asking this but she will make sure that she continues on it.

## 2020-06-07 DIAGNOSIS — M810 Age-related osteoporosis without current pathological fracture: Secondary | ICD-10-CM | POA: Diagnosis not present

## 2020-10-20 ENCOUNTER — Telehealth: Payer: Self-pay | Admitting: Cardiology

## 2020-10-20 NOTE — Telephone Encounter (Signed)
Spoke with the patient who states that she has been having chest tightness for a few months now. She states that it occurs usually in the evening when she is resting in her chair. It was happening only about once every other week but the past couple weeks it has been more frequent. States that she does feel her heart racing sometimes during these instances. She is able to relax and the tightness goes away. She denies and associated symptoms such as SOB, N/V, dizziness. She denies eating heavy meals prior to having chest tightness.  Patient is overdue for her 6 month FU. Offered patient an appointment tomorrow, however she declined. She is scheduled for 3/23. Advised to call back if symptoms worsen before then.  Patient also advised on ER precautions. Patient verbalized understanding.

## 2020-10-20 NOTE — Telephone Encounter (Signed)
Pt c/o of Chest Pain: STAT if CP now or developed within 24 hours  1. Are you having CP right now? no  2. Are you experiencing any other symptoms (ex. SOB, nausea, vomiting, sweating)? no  3. How long have you been experiencing CP? Started months ago  4. Is your CP continuous or coming and going? Comes and goes  5. Have you taken Nitroglycerin? no ?  Patient states she has been having chest tightness for months. She states she tries to relax when it happens to help it go away.

## 2020-11-03 ENCOUNTER — Ambulatory Visit: Payer: Medicare PPO | Admitting: Cardiology

## 2020-11-03 ENCOUNTER — Encounter (INDEPENDENT_AMBULATORY_CARE_PROVIDER_SITE_OTHER): Payer: Self-pay

## 2020-11-03 ENCOUNTER — Encounter: Payer: Self-pay | Admitting: Cardiology

## 2020-11-03 ENCOUNTER — Other Ambulatory Visit: Payer: Self-pay

## 2020-11-03 VITALS — BP 160/70 | HR 60 | Ht 64.0 in | Wt 140.0 lb

## 2020-11-03 DIAGNOSIS — I2510 Atherosclerotic heart disease of native coronary artery without angina pectoris: Secondary | ICD-10-CM | POA: Diagnosis not present

## 2020-11-03 DIAGNOSIS — I351 Nonrheumatic aortic (valve) insufficiency: Secondary | ICD-10-CM

## 2020-11-03 DIAGNOSIS — I10 Essential (primary) hypertension: Secondary | ICD-10-CM

## 2020-11-03 DIAGNOSIS — I272 Pulmonary hypertension, unspecified: Secondary | ICD-10-CM | POA: Diagnosis not present

## 2020-11-03 DIAGNOSIS — I77810 Thoracic aortic ectasia: Secondary | ICD-10-CM | POA: Diagnosis not present

## 2020-11-03 DIAGNOSIS — E785 Hyperlipidemia, unspecified: Secondary | ICD-10-CM

## 2020-11-03 DIAGNOSIS — R0789 Other chest pain: Secondary | ICD-10-CM

## 2020-11-03 NOTE — Patient Instructions (Signed)
Medication Instructions:  Your physician recommends that you continue on your current medications as directed. Please refer to the Current Medication list given to you today.  *If you need a refill on your cardiac medications before your next appointment, please call your pharmacy*   Testing/Procedures: Your physician has requested that you have an echocardiogram. Echocardiography is a painless test that uses sound waves to create images of your heart. It provides your doctor with information about the size and shape of your heart and how well your heart's chambers and valves are working. This procedure takes approximately one hour. There are no restrictions for this procedure.  Follow-Up: At [facility], you and your health needs are our priority.  As part of our continuing mission to provide you with exceptional heart care, we have created designated Provider Care Teams.  These Care Teams include your primary Cardiologist (physician) and Advanced Practice Providers (APPs -  Physician Assistants and Nurse Practitioners) who all work together to provide you with the care you need, when you need it.   Your next appointment:   6 week(s)  The format for your next appointment:   In Person  Provider:   You will see one of the following Advanced Practice Providers on your designated Care Team:    Ronie Spies, PA-C  Jacolyn Reedy, PA-C  Then, Armanda Magic, MD will plan to see you again in 1 year(s).   Other Instructions You have been referred to see a gastroenterologist.

## 2020-11-03 NOTE — Progress Notes (Signed)
Cardiology Office Note:    Date:  11/03/2020   ID:  Kimberly [name], DOB [birth year], MRN [id number]  PCP:  Catha Gosselin, MD  Cardiologist:  Armanda Magic, MD    Referring MD: Catha Gosselin, MD   Chief Complaint  Patient presents with  . Coronary Artery Disease  . Aortic Insuffiency  . Hypertension    History of Present Illness:    Kimberly [name] is a 87 y.o. female with a hx of CAD s/p remote CABG in 2007, mildly dilated ascending aorta, mild AS/AI, dyslipidemia (followed by PCP), HTN, arthritis, GERD, hiatal hernia, hypothyroidism, pulmonary HTN. Last echo 05/2017 showed mild LVH, EF 60-65%, grade 1 DD, mild AS/AI, mildly dilated ascending aorta, mild increase PASP . Nuclear stress test 06/2018 was normal, EF 62%.   The last time I saw her she complained of chest discomfort and Lexiscan myoview showed no ischemia. 2D echo showed normal LVF with mild MR and mild to moderate AR.    She is here today for followup and is doing well.  She denies any chest pain or pressure, SOB, DOE, PND, orthopnea, LE edema, dizziness, palpitations or syncope. She is compliant with her meds and is tolerating meds with no SE.   She tells me that at night she wakes up and sometimes gets up  to watch TV and will feel a discomfort in her chest but is very random and is a pressure centrally located.  There is no radiation of the discomfort and she has no diaphoresis or nausea with it.  If she relaxes she will feel her heart beating fast.  It resolves on its own. She has no exertional chest discomfort when doing her ADLs.  She does have problems with her back as well.  She denies any SOB or DOE.  She has similar discomfort a year ago when she had a stress test done. She has a hx of GERD as well.   Past Medical History:  Diagnosis Date  . Aortic regurgitation 04/30/2014   Moderate by echo 11/2019  . Aortic stenosis    mild by echo 2018 and normal gradient byecho 11/2019  . Arthritis   . Ascending aorta  dilatation (HCC)    ascending aortic root was 53mm on echo 11/2019  . Benign essential HTN   . Complication of anesthesia    SLOW TO WAKE UP  . Coronary artery disease    S/P CABG 2007  . Dyslipidemia   . GERD (gastroesophageal reflux disease)   . H/O hiatal hernia    w distal esophageal ulceration  . Hyperlipidemia   . Hypertension   . Hypothyroidism   . Neuromuscular disorder (HCC)   . Osteoporosis   . Pulmonary HTN (HCC)    mild at by echo 11/2019  . Vertigo     Past Surgical History:  Procedure Laterality Date  . BACK SURGERY  28 YRS AGO  . CARDIAC CATHETERIZATION    . CORONARY ARTERY BYPASS GRAFT  2007  . CORONARY ARTERY BYPASS GRAFT  12/2005  . ESOPHAGOGASTRODUODENOSCOPY     Multiple  . FLEXIBLE SIGMOIDOSCOPY  09/1998  . LAMINECTOMY  10/23/2011  . LUMBAR LAMINECTOMY/DECOMPRESSION MICRODISCECTOMY  10/23/2011   Procedure: LUMBAR LAMINECTOMY/DECOMPRESSION MICRODISCECTOMY 1 LEVEL;  Surgeon: Temple Pacini, MD;  Location: [facility];  Service: Neurosurgery;  Laterality: Left;  Left Lumbar Four-Five Microdiskectomy/Laminectomy  . TONSILLECTOMY      Current Medications: Current Meds  Medication Sig  . aspirin EC 81 MG tablet Take  81 mg by mouth every morning. HOLD FOR PROCEDURE  . atorvastatin (LIPITOR) 80 MG tablet Take 1 tablet (80 mg total) by mouth daily.  . Calcium Carbonate-Vitamin D (CALCIUM + D PO) Take 1 tablet by mouth daily.   Marland Kitchen levothyroxine (SYNTHROID, LEVOTHROID) 50 MCG tablet Take 50 mcg by mouth at bedtime.  Marland Kitchen lisinopril (PRINIVIL,ZESTRIL) 5 MG tablet Take 5 mg by mouth daily.  . Multiple Vitamin (MULITIVITAMIN WITH MINERALS) TABS Take 1 tablet by mouth every morning.  . pantoprazole (PROTONIX) 40 MG tablet Take 40 mg by mouth 2 (two) times daily before a meal.  . vitamin B-12 (CYANOCOBALAMIN) 1000 MCG tablet Take 1,000 mcg by mouth daily.     Allergies:   Amoxicillin   Social History   Socioeconomic History  . Marital status: Widowed    Spouse  name: Not on file  . Number of children: Not on file  . Years of education: Not on file  . Highest education level: Not on file  Occupational History  . Not on file  Tobacco Use  . Smoking status: Never Smoker  . Smokeless tobacco: Never Used  Vaping Use  . Vaping Use: Never used  Substance and Sexual Activity  . Alcohol use: No  . Drug use: No  . Sexual activity: Not Currently    Birth control/protection: Post-menopausal  Other Topics Concern  . Not on file  Social History Narrative  . Not on file   Social Determinants of Health   Financial Resource Strain: Not on file  Food Insecurity: Not on file  Transportation Needs: Not on file  Physical Activity: Not on file  Stress: Not on file  Social Connections: Not on file     Family History: The patient's family history includes Cancer in her father; Heart disease in her mother; High Cholesterol in her sister.  ROS:   Please see the history of present illness.    ROS  All other systems reviewed and negative.   EKGs/Labs/Other Studies Reviewed:   The following studies were reviewed today: Lexiscan Myoview 11/2019 Study Highlights   EKG without significant ST changes to suggest ischemia  Myovue with normal perfusion. No ischemia or scar  LVEF 70%  This is a low risk study.   2D echo 11/2019 IMPRESSIONS   1. Left ventricular ejection fraction, by estimation, is 60 to 65%. The  left ventricle has normal function. The left ventricle has no regional  wall motion abnormalities. Left ventricular diastolic parameters are  consistent with Grade II diastolic  dysfunction (pseudonormalization).  2. Right ventricular systolic function is normal. The right ventricular  size is normal. There is mildly elevated pulmonary artery systolic  pressure. The estimated right ventricular systolic pressure is 38.5 mmHg.  3. Left atrial size was mildly dilated.  4. The mitral valve is normal in structure. Mild mitral valve   regurgitation. No evidence of mitral stenosis.  5. The aortic valve is tricuspid. Aortic valve regurgitation is moderate.  Mild to moderate aortic valve sclerosis/calcification is present, without  any evidence of aortic stenosis. Aortic valve mean gradient measures 8.0  mmHg.  6. The inferior vena cava is normal in size with <50% respiratory  variability, suggesting right atrial pressure of 8 mmHg.   EKG:  EKG is  ordered today and demonstrates sinus bradycardia with LAFB  Recent Labs: 11/28/2019: ALT 17; BUN 13; Creatinine, Ser 1.03; Potassium 4.5; Sodium 143   Recent Lipid Panel    Component Value Date/Time   CHOL 128 11/28/2019  0744   TRIG 45 11/28/2019 0744   HDL 68 11/28/2019 0744   CHOLHDL 1.9 11/28/2019 0744   CHOLHDL 3 05/27/2014 0845   VLDL 13.4 05/27/2014 0845   LDLCALC 49 11/28/2019 0744    Physical Exam:    VS:  BP (!) 160/70 (BP Location: Left Arm, Patient Position: Sitting, Cuff Size: Normal)   Pulse 60   Ht 5\' 4"  (1.626 m)   Wt 140 lb (63.5 kg)   SpO2 96%   BMI 24.03 kg/m     Wt Readings from Last 3 Encounters:  11/03/20 140 lb (63.5 kg)  01/14/20 153 lb (69.4 kg)  11/28/19 152 lb (68.9 kg)     GEN: Well nourished, well developed in no acute distress HEENT: Normal NECK: No JVD; No carotid bruits LYMPHATICS: No lymphadenopathy CARDIAC:RRR, no murmurs, rubs, gallops RESPIRATORY:  Clear to auscultation without rales, wheezing or rhonchi  ABDOMEN: Soft, non-tender, non-distended MUSCULOSKELETAL:  No edema; No deformity  SKIN: Warm and dry NEUROLOGIC:  Alert and oriented x 3 PSYCHIATRIC:  Normal affect    ASSESSMENT:    1. Atherosclerosis of native coronary artery of native heart without angina pectoris   2. Nonrheumatic aortic valve insufficiency   3. Pulmonary HTN (HCC)   4. Mild dilation of ascending aorta (HCC)   5. Benign essential HTN   6. Dyslipidemia    PLAN:    In order of problems listed above:  1.  ASCAD -s/p remote  CABG -Lexiscan myoview for CP at last OV showed no ischemia -she continues to have intermittent chest discomfort that only occurs at night and has no associated sx.  This is similar to last year when a nuclear stress test showed no ischemia -I do not think that her sx are cardiac as she never gets them when she exerts herself and it is always at night>>will refer to GI first for evaluation  -Continue ASA and statin  2.  Aortic Insufficency -no AS and moderate AR by echo 11/2019 -repeat echo to make sure AI has not progressed  3.  Pulmonary HTN -very mild with PASP 12/2019 by echo 2021  4.  Dilated ascending aorta -75mm by echo 2018 and 2021  5.  HTN -BP borderline controlled on exam today -I will get a 48 hour BP cuff to assess BP -continue Lisinopril 5mg  daily -SCr stable at 1 in Aug 2021  6.  HLD -LDL goal < 70 -LDL was 67 in July 2020 -continue Atorva 80mg  daily -LDL was 64 in July 2021   Medication Adjustments/Labs and Tests Ordered: Current medicines are reviewed at length with the patient today.  Concerns regarding medicines are outlined above.  No orders of the defined types were placed in this encounter.  No orders of the defined types were placed in this encounter.   Signed, August 2020, MD  11/03/2020 2:01 PM    [facility]

## 2020-11-03 NOTE — Addendum Note (Signed)
Addended by: Theresia Majors on: 11/03/2020 02:31 PM   Modules accepted: Orders

## 2020-11-04 ENCOUNTER — Telehealth: Payer: Self-pay | Admitting: *Deleted

## 2020-11-04 NOTE — Telephone Encounter (Signed)
LMVM Calling to schedule 24 hour ambulatory blood pressure monitor.  Have appointment available for application on Monday.  Please call Shelly in monitor department at [phone] to schedule.

## 2020-11-05 NOTE — Telephone Encounter (Signed)
Patient returning a call from [facility] to schedule appt for BP monitor.

## 2020-11-05 NOTE — Telephone Encounter (Signed)
Returned call to patient and scheduled her to have a BP Monitor applied on 3/29 @8 :30

## 2020-11-08 ENCOUNTER — Other Ambulatory Visit: Payer: Self-pay | Admitting: Gastroenterology

## 2020-11-08 DIAGNOSIS — K449 Diaphragmatic hernia without obstruction or gangrene: Secondary | ICD-10-CM

## 2020-11-09 ENCOUNTER — Other Ambulatory Visit: Payer: Medicare PPO

## 2020-11-09 ENCOUNTER — Other Ambulatory Visit: Payer: Self-pay

## 2020-11-09 ENCOUNTER — Telehealth: Payer: Self-pay | Admitting: Cardiology

## 2020-11-09 DIAGNOSIS — R0789 Other chest pain: Secondary | ICD-10-CM

## 2020-11-09 NOTE — Telephone Encounter (Signed)
Order and appointment cancelled.

## 2020-11-09 NOTE — Telephone Encounter (Signed)
See previous phone note. Patient came into the office and has been advised that one of the monitor techs will give her a call later.

## 2020-11-09 NOTE — Telephone Encounter (Signed)
Patient return the BP monitor - she stated she can't wrap the monitor around her leg without help.   She also stated she will have to have someone stay with her 24 hrs while she is wearing the monitor to help.   Wanted Dr. Mayford Knife to know that she has seen the GI doctor and now she is going to have some tests done.  At this time she done feel like she need the monitor.  Please call to advice if she will be changed for the BP monitor,  since she didn't wear for 24 hrs.

## 2020-11-09 NOTE — Telephone Encounter (Signed)
Pt called very frustrated stating she is bringing back the Monitor she received today. She states it does not work for her and is unable to do it by herself

## 2020-11-09 NOTE — Telephone Encounter (Signed)
Pt had BP monitor applied this morning.  Now in the office stating that it keeps beeping.  Pt advised to go home and we would send message to monitor tech to call pt to discuss.

## 2020-11-12 NOTE — Addendum Note (Signed)
Addended by: Louanne Belton, Colon Rueth A on: 11/12/2020 10:27 AM   Modules accepted: Orders

## 2020-11-17 ENCOUNTER — Ambulatory Visit: Payer: Medicare PPO | Admitting: Cardiology

## 2020-11-18 ENCOUNTER — Ambulatory Visit
Admission: RE | Admit: 2020-11-18 | Discharge: 2020-11-18 | Disposition: A | Discharge status: Home / Self Care | Payer: Medicare PPO | Source: Ambulatory Visit | Attending: Gastroenterology | Admitting: Gastroenterology

## 2020-11-18 DIAGNOSIS — K449 Diaphragmatic hernia without obstruction or gangrene: Secondary | ICD-10-CM

## 2020-12-03 ENCOUNTER — Encounter (HOSPITAL_COMMUNITY): Payer: Self-pay

## 2020-12-03 ENCOUNTER — Other Ambulatory Visit (HOSPITAL_COMMUNITY): Payer: Medicare PPO

## 2020-12-03 ENCOUNTER — Ambulatory Visit (HOSPITAL_COMMUNITY): Payer: Medicare PPO

## 2020-12-03 ENCOUNTER — Telehealth (HOSPITAL_COMMUNITY): Payer: Self-pay | Admitting: Cardiology

## 2020-12-03 ENCOUNTER — Other Ambulatory Visit: Payer: Self-pay

## 2020-12-03 NOTE — Telephone Encounter (Signed)
Patient came to office on 12/03/20 and waited for a period of time for echocardigram.  She cancelled the appointment per Erie Noe and will call us back to reschedule at a later date. Order will be removed from the WQ and when patient calls back to reschedule we will reinstate the order. Thank you.

## 2020-12-29 ENCOUNTER — Ambulatory Visit: Payer: Medicare PPO | Admitting: Physician Assistant

## 2021-04-20 DIAGNOSIS — Z961 Presence of intraocular lens: Secondary | ICD-10-CM | POA: Diagnosis not present

## 2021-04-20 DIAGNOSIS — H35373 Puckering of macula, bilateral: Secondary | ICD-10-CM | POA: Diagnosis not present

## 2021-04-20 DIAGNOSIS — H524 Presbyopia: Secondary | ICD-10-CM | POA: Diagnosis not present

## 2021-04-20 DIAGNOSIS — H43813 Vitreous degeneration, bilateral: Secondary | ICD-10-CM | POA: Diagnosis not present

## 2021-05-03 DIAGNOSIS — Z23 Encounter for immunization: Secondary | ICD-10-CM | POA: Diagnosis not present

## 2021-06-09 DIAGNOSIS — M810 Age-related osteoporosis without current pathological fracture: Secondary | ICD-10-CM | POA: Diagnosis not present

## 2021-07-04 DIAGNOSIS — N183 Chronic kidney disease, stage 3 unspecified: Secondary | ICD-10-CM | POA: Diagnosis not present

## 2021-07-04 DIAGNOSIS — R413 Other amnesia: Secondary | ICD-10-CM | POA: Diagnosis not present

## 2021-07-04 DIAGNOSIS — E7800 Pure hypercholesterolemia, unspecified: Secondary | ICD-10-CM | POA: Diagnosis not present

## 2021-07-04 DIAGNOSIS — Z0000 Encounter for general adult medical examination without abnormal findings: Secondary | ICD-10-CM | POA: Diagnosis not present

## 2021-07-04 DIAGNOSIS — M810 Age-related osteoporosis without current pathological fracture: Secondary | ICD-10-CM | POA: Diagnosis not present

## 2021-07-04 DIAGNOSIS — E039 Hypothyroidism, unspecified: Secondary | ICD-10-CM | POA: Diagnosis not present

## 2021-07-04 DIAGNOSIS — I2510 Atherosclerotic heart disease of native coronary artery without angina pectoris: Secondary | ICD-10-CM | POA: Diagnosis not present

## 2021-07-04 DIAGNOSIS — I10 Essential (primary) hypertension: Secondary | ICD-10-CM | POA: Diagnosis not present

## 2021-07-04 DIAGNOSIS — K219 Gastro-esophageal reflux disease without esophagitis: Secondary | ICD-10-CM | POA: Diagnosis not present

## 2021-08-22 DIAGNOSIS — I10 Essential (primary) hypertension: Secondary | ICD-10-CM | POA: Diagnosis not present

## 2021-09-06 DIAGNOSIS — Z7189 Other specified counseling: Secondary | ICD-10-CM | POA: Diagnosis not present

## 2021-09-28 ENCOUNTER — Other Ambulatory Visit (HOSPITAL_COMMUNITY): Payer: Medicare PPO

## 2021-09-30 ENCOUNTER — Encounter (INDEPENDENT_AMBULATORY_CARE_PROVIDER_SITE_OTHER): Payer: Self-pay

## 2021-09-30 ENCOUNTER — Ambulatory Visit (HOSPITAL_COMMUNITY): Payer: Medicare PPO | Attending: Internal Medicine

## 2021-09-30 ENCOUNTER — Other Ambulatory Visit: Payer: Self-pay

## 2021-09-30 DIAGNOSIS — I2510 Atherosclerotic heart disease of native coronary artery without angina pectoris: Secondary | ICD-10-CM

## 2021-09-30 LAB — ECHOCARDIOGRAM COMPLETE
AR max vel: 1.16 cm2
AV Area VTI: 1.1 cm2
AV Area mean vel: 1.05 cm2
AV Mean grad: 10 mmHg
AV Peak grad: 21.9 mmHg
Ao pk vel: 2.34 m/s
Area-P 1/2: 3.66 cm2
P 1/2 time: 530 msec
S' Lateral: 2.6 cm

## 2021-10-11 ENCOUNTER — Encounter: Payer: Self-pay | Admitting: Cardiology

## 2021-10-11 ENCOUNTER — Telehealth: Payer: Self-pay | Admitting: Cardiology

## 2021-10-11 NOTE — Telephone Encounter (Signed)
Patient called requesting results of echocardiogram be mailed to her as she does not have access to MyChart.  Echo results mailed.

## 2021-10-11 NOTE — Telephone Encounter (Signed)
Pt is wanting to know if she is able to have her results mailed... please advise

## 2021-12-05 DIAGNOSIS — H8111 Benign paroxysmal vertigo, right ear: Secondary | ICD-10-CM | POA: Diagnosis not present

## 2021-12-05 DIAGNOSIS — I10 Essential (primary) hypertension: Secondary | ICD-10-CM | POA: Diagnosis not present

## 2021-12-07 ENCOUNTER — Ambulatory Visit: Payer: Medicare PPO | Admitting: Cardiology

## 2021-12-09 ENCOUNTER — Ambulatory Visit: Payer: Medicare PPO | Admitting: Cardiology

## 2021-12-09 ENCOUNTER — Encounter: Payer: Self-pay | Admitting: Cardiology

## 2021-12-09 VITALS — BP 140/60 | HR 54 | Ht 64.0 in | Wt 135.0 lb

## 2021-12-09 DIAGNOSIS — I77810 Thoracic aortic ectasia: Secondary | ICD-10-CM

## 2021-12-09 DIAGNOSIS — I10 Essential (primary) hypertension: Secondary | ICD-10-CM | POA: Diagnosis not present

## 2021-12-09 DIAGNOSIS — I2510 Atherosclerotic heart disease of native coronary artery without angina pectoris: Secondary | ICD-10-CM

## 2021-12-09 DIAGNOSIS — E785 Hyperlipidemia, unspecified: Secondary | ICD-10-CM

## 2021-12-09 DIAGNOSIS — I272 Pulmonary hypertension, unspecified: Secondary | ICD-10-CM | POA: Diagnosis not present

## 2021-12-09 DIAGNOSIS — I351 Nonrheumatic aortic (valve) insufficiency: Secondary | ICD-10-CM | POA: Diagnosis not present

## 2021-12-09 MED ORDER — EZETIMIBE 10 MG PO TABS: 10.0000 mg | ORAL_TABLET | Freq: Every day | ORAL | 3 refills | Status: DC

## 2021-12-09 NOTE — Addendum Note (Signed)
Addended by: Antonieta Iba on: 12/09/2021 10:21 AM ? ? Modules accepted: Orders ? ?

## 2021-12-09 NOTE — Patient Instructions (Signed)
Medication Instructions:  ?Your physician has recommended you make the following change in your medication:  ?1) START taking zetia 10 mg daily ?*If you need a refill on your cardiac medications before your next appointment, please call your pharmacy* ? ?Lab Work: ?Fasting lipids and ALT in 6 weeks ?If you have labs (blood work) drawn today and your tests are completely normal, you will receive your results only by: ?MyChart Message (if you have MyChart) OR ?A paper copy in the mail ?If you have any lab test that is abnormal or we need to change your treatment, we will call you to review the results. ? ?Testing/Procedures: ?Your physician has requested that you have an echocardiogram in February 2023. Echocardiography is a painless test that uses sound waves to create images of your heart. It provides your doctor with information about the size and shape of your heart and how well your heart?s chambers and valves are working. This procedure takes approximately one hour. There are no restrictions for this procedure. ? ?Follow-Up: ?At [facility], you and your health needs are our priority.  As part of our continuing mission to provide you with exceptional heart care, we have created designated Provider Care Teams.  These Care Teams include your primary Cardiologist (physician) and Advanced Practice Providers (APPs -  Physician Assistants and Nurse Practitioners) who all work together to provide you with the care you need, when you need it. ? ?Your next appointment:   ?1 year(s) ? ?The format for your next appointment:   ?In Person ? ?Provider:   ?Armanda Magic, MD   ? ?Important Information About Sugar ? ? ? ? ?  ?

## 2021-12-09 NOTE — Progress Notes (Signed)
?Cardiology Office Note:   ? ?Date:  12/09/2021  ? ?ID:  Kimberly [name], DOB [birth year], MRN [id number] ? ?PCP:  Clayborn Heron, MD  ?Cardiologist:  Armanda Magic, MD   ? ?Referring MD: Clayborn Heron, MD  ? ?Chief Complaint  ?Patient presents with  ? Coronary Artery Disease  ? Hypertension  ? Hyperlipidemia  ? Aortic Insuffiency  ? ? ?History of Present Illness:   ? ?Kimberly [name] is a 88 y.o. female with a hx of CAD s/p remote CABG in 2007, mildly dilated ascending aorta, moderate eccentric aortic insufficiency, dyslipidemia (followed by PCP), HTN, arthritis, GERD, hiatal hernia, hypothyroidism, pulmonary HTN.  Nuclear stress test 2021 was normal, EF 62%. 2D echo 09/2021 showed EF 60 to 65%, severe left atrial enlargement, mild MR, moderate eccentric AI.  She has had a hx of chronic CP at night related to GERD which has resolved.  ? ?She is here today for followup and is doing well.  She denies any chest pain or pressure, SOB, DOE, PND, orthopnea, LE edema, dizziness, palpitations or syncope. She is compliant with her meds and is tolerating meds with no SE.    ? ?Past Medical History:  ?Diagnosis Date  ? Aortic regurgitation 04/30/2014  ? Moderate by echo 11/2019  ? Aortic stenosis   ? mild by echo 2018 and normal gradient byecho 11/2019  ? Arthritis   ? Ascending aorta dilatation (HCC)   ? ascending aortic root was 38mm on echo 11/2019  ? Benign essential HTN   ? Complication of anesthesia   ? SLOW TO WAKE UP  ? Coronary artery disease   ? S/P CABG 2007  ? Dyslipidemia   ? GERD (gastroesophageal reflux disease)   ? H/O hiatal hernia   ? w distal esophageal ulceration  ? Hyperlipidemia   ? Hypertension   ? Hypothyroidism   ? Neuromuscular disorder (HCC)   ? Osteoporosis   ? Pulmonary HTN (HCC)   ? mild at by echo 11/2019  ? Vertigo   ? ? ?Past Surgical History:  ?Procedure Laterality Date  ? BACK SURGERY  28 YRS AGO  ? CARDIAC CATHETERIZATION    ? CORONARY ARTERY BYPASS GRAFT  2007  ? CORONARY ARTERY  BYPASS GRAFT  12/2005  ? ESOPHAGOGASTRODUODENOSCOPY    ? Multiple  ? FLEXIBLE SIGMOIDOSCOPY  09/1998  ? LAMINECTOMY  10/23/2011  ? LUMBAR LAMINECTOMY/DECOMPRESSION MICRODISCECTOMY  10/23/2011  ? Procedure: LUMBAR LAMINECTOMY/DECOMPRESSION MICRODISCECTOMY 1 LEVEL;  Surgeon: Temple Pacini, MD;  Location: [facility];  Service: Neurosurgery;  Laterality: Left;  Left Lumbar Four-Five Microdiskectomy/Laminectomy  ? TONSILLECTOMY    ? ? ?Current Medications: ?Current Meds  ?Medication Sig  ? aspirin EC 81 MG tablet Take 81 mg by mouth every morning. HOLD FOR PROCEDURE  ? atorvastatin (LIPITOR) 80 MG tablet Take 1 tablet (80 mg total) by mouth daily.  ? Calcium Carbonate-Vitamin D (CALCIUM + D PO) Take 1 tablet by mouth daily.   ? levothyroxine (SYNTHROID, LEVOTHROID) 50 MCG tablet Take 50 mcg by mouth at bedtime.  ? lisinopril (PRINIVIL,ZESTRIL) 5 MG tablet Take 5 mg by mouth daily.  ? Multiple Vitamin (MULITIVITAMIN WITH MINERALS) TABS Take 1 tablet by mouth every morning.  ? pantoprazole (PROTONIX) 20 MG tablet Take by mouth daily at 6 (six) AM.  ? sucralfate (CARAFATE) 1 g tablet Take by mouth daily at 6 (six) AM.  ? vitamin B-12 (CYANOCOBALAMIN) 1000 MCG tablet Take 1,000 mcg by mouth daily.  ?  ? ?  Allergies:   Amoxicillin  ? ?Social History  ? ?Socioeconomic History  ? Marital status: Widowed  ?  Spouse name: Not on file  ? Number of children: Not on file  ? Years of education: Not on file  ? Highest education level: Not on file  ?Occupational History  ? Not on file  ?Tobacco Use  ? Smoking status: Never  ? Smokeless tobacco: Never  ?Vaping Use  ? Vaping Use: Never used  ?Substance and Sexual Activity  ? Alcohol use: No  ? Drug use: No  ? Sexual activity: Not Currently  ?  Birth control/protection: Post-menopausal  ?Other Topics Concern  ? Not on file  ?Social History Narrative  ? Not on file  ? ?Social Determinants of Health  ? ?Financial Resource Strain: Not on file  ?Food Insecurity: Not on file  ?Transportation  Needs: Not on file  ?Physical Activity: Not on file  ?Stress: Not on file  ?Social Connections: Not on file  ?  ? ?Family History: ?The patient's family history includes Cancer in her father; Heart disease in her mother; High Cholesterol in her sister. ? ?ROS:   ?Please see the history of present illness.    ?ROS  ?All other systems reviewed and negative.  ? ?EKGs/Labs/Other Studies Reviewed:  ? ?The following studies were reviewed today: ?Lexiscan Myoview 11/2019 ?Study Highlights ? ?EKG without significant ST changes to suggest ischemia ?Myovue with normal perfusion. No ischemia or scar ?LVEF 70% ?This is a low risk study. ? ? ?2D echo 11/2019 ?IMPRESSIONS  ? ? 1. Left ventricular ejection fraction, by estimation, is 60 to 65%. The  ?left ventricle has normal function. The left ventricle has no regional  ?wall motion abnormalities. Left ventricular diastolic parameters are  ?consistent with Grade II diastolic  ?dysfunction (pseudonormalization).  ? 2. Right ventricular systolic function is normal. The right ventricular  ?size is normal. There is mildly elevated pulmonary artery systolic  ?pressure. The estimated right ventricular systolic pressure is 38.5 mmHg.  ? 3. Left atrial size was mildly dilated.  ? 4. The mitral valve is normal in structure. Mild mitral valve  ?regurgitation. No evidence of mitral stenosis.  ? 5. The aortic valve is tricuspid. Aortic valve regurgitation is moderate.  ?Mild to moderate aortic valve sclerosis/calcification is present, without  ?any evidence of aortic stenosis. Aortic valve mean gradient measures 8.0  ?mmHg.  ? 6. The inferior vena cava is normal in size with <50% respiratory  ?variability, suggesting right atrial pressure of 8 mmHg.  ? ?EKG:  EKG is ordered today and demonstrates  Sinus bradycardia at 54bpm with LAFB and LVH by voltage with reopl abnormality ? ?Recent Labs: ?No results found for requested labs within last 8760 hours.  ? ?Recent Lipid Panel ?   ?Component Value  Date/Time  ? CHOL 128 11/28/2019 0744  ? TRIG 45 11/28/2019 0744  ? HDL 68 11/28/2019 0744  ? CHOLHDL 1.9 11/28/2019 0744  ? CHOLHDL 3 05/27/2014 0845  ? VLDL 13.4 05/27/2014 0845  ? LDLCALC 49 11/28/2019 0744  ? ? ?Physical Exam:   ? ?VS:  BP 140/60   Pulse (!) 54   Ht 5\' 4"  (1.626 m)   Wt 135 lb (61.2 kg)   SpO2 95%   BMI 23.17 kg/m?    ? ?Wt Readings from Last 3 Encounters:  ?12/09/21 135 lb (61.2 kg)  ?11/03/20 140 lb (63.5 kg)  ?01/14/20 153 lb (69.4 kg)  ?  ?GEN: Well nourished, well developed in  no acute distress ?HEENT: Normal ?NECK: No JVD; No carotid bruits ?LYMPHATICS: No lymphadenopathy ?CARDIAC:RRR, no rubs, gallops.  2/6 SM at RUSB to LUSB ?RESPIRATORY:  Clear to auscultation without rales, wheezing or rhonchi  ?ABDOMEN: Soft, non-tender, non-distended ?MUSCULOSKELETAL:  No edema; No deformity  ?SKIN: Warm and dry ?NEUROLOGIC:  Alert and oriented x 3 ?PSYCHIATRIC:  Normal affect   ? ?ASSESSMENT:   ? ?1. Atherosclerosis of native coronary artery of native heart without angina pectoris   ?2. Nonrheumatic aortic valve insufficiency   ?3. Pulmonary HTN (HCC)   ?4. Mild dilation of ascending aorta (HCC)   ?5. Benign essential HTN   ?6. Dyslipidemia   ? ?PLAN:   ? ?In order of problems listed above: ? ?1.  ASCAD ?-s/p remote CABG ?-Her last Lexiscan Myoview in 2021 showed no ischemia ?-She has had several years of intermittent chest discomfort that only occurs at night and not with exertion and is felt to be related to GERD and the pain has resolved ?-Continue ASA 81 mg daily and statin therapy ? ?2.  Aortic Insufficency ?-2D echo 09/2021 showed moderate AI which was eccentric ?-Repeat 2D echo 09/2022 ? ?3.  Pulmonary HTN ?-very mild with PASP 38mmHg by echo 2021 and no pulmonary hypertension by echo 09/2021 ? ?4.  Dilated ascending aorta ?-37mm by echo 2018 and 2021 and normal by echo 2023 ? ?5.  HTN ?-BP is adequately controlled on exam today ?-Continue prescription drug management with lisinopril 5 mg  daily with as needed refills ?-I have personally reviewed and interpreted outside labs performed by patient's PCP which showed serum creatinine 0.98 and potassium 4.4 on 07/04/2021 ? ?6.  HLD ?-LDL goal < 70 ?-I

## 2021-12-22 ENCOUNTER — Telehealth: Payer: Self-pay | Admitting: Cardiology

## 2021-12-22 NOTE — Telephone Encounter (Signed)
Pt c/o medication issue: ? ?1. Name of Medication: lisinopril (PRINIVIL,ZESTRIL) 5 MG tablet ? ?2. How are you currently taking this medication (dosage and times per day)? Take 5 mg by mouth daily. ? ?3. Are you having a reaction (difficulty breathing--STAT)?  ? ?4. What is your medication issue? Patient would like to know if she should take the medication in the morning or in the evening.  Please advise.  ?

## 2021-12-22 NOTE — Telephone Encounter (Signed)
Spoke with the patient and she takes her lisinopril in the morning and she can continue doing so as long as she is taking it at the same time every day. I have also advised her that she has lab work scheduled for 6/12. She understands that she will need to be fasting for this.  ?

## 2022-01-02 ENCOUNTER — Other Ambulatory Visit: Payer: Medicare PPO | Admitting: *Deleted

## 2022-01-02 DIAGNOSIS — I10 Essential (primary) hypertension: Secondary | ICD-10-CM | POA: Diagnosis not present

## 2022-01-02 DIAGNOSIS — E785 Hyperlipidemia, unspecified: Secondary | ICD-10-CM

## 2022-01-02 DIAGNOSIS — I2510 Atherosclerotic heart disease of native coronary artery without angina pectoris: Secondary | ICD-10-CM

## 2022-01-02 DIAGNOSIS — I77810 Thoracic aortic ectasia: Secondary | ICD-10-CM

## 2022-01-02 DIAGNOSIS — I351 Nonrheumatic aortic (valve) insufficiency: Secondary | ICD-10-CM | POA: Diagnosis not present

## 2022-01-02 DIAGNOSIS — M810 Age-related osteoporosis without current pathological fracture: Secondary | ICD-10-CM | POA: Diagnosis not present

## 2022-01-02 DIAGNOSIS — I272 Pulmonary hypertension, unspecified: Secondary | ICD-10-CM | POA: Diagnosis not present

## 2022-01-02 LAB — LIPID PANEL
Chol/HDL Ratio: 1.8 ratio (ref 0.0–4.4)
Cholesterol, Total: 127 mg/dL (ref 100–199)
HDL: 70 mg/dL (ref >39)
LDL Chol Calc (NIH): 46 mg/dL (ref 0–99)
Triglycerides: 45 mg/dL (ref 0–149)
VLDL Cholesterol Cal: 11 mg/dL (ref 5–40)

## 2022-01-02 LAB — ALT: ALT: 13 IU/L (ref 0–32)

## 2022-01-13 ENCOUNTER — Other Ambulatory Visit: Payer: Self-pay

## 2022-01-13 MED ORDER — EZETIMIBE 10 MG PO TABS: 10.0000 mg | ORAL_TABLET | Freq: Every day | ORAL | 3 refills | Status: AC

## 2022-01-18 NOTE — Telephone Encounter (Signed)
Entered in error

## 2022-01-23 ENCOUNTER — Other Ambulatory Visit: Payer: Medicare PPO

## 2022-03-08 ENCOUNTER — Telehealth: Payer: Self-pay | Admitting: Cardiology

## 2022-03-08 NOTE — Telephone Encounter (Signed)
Left message for patient on other number listed [phone]) to call back to discuss BP.

## 2022-03-08 NOTE — Telephone Encounter (Signed)
Left message for patient to call back  

## 2022-03-08 NOTE — Telephone Encounter (Signed)
Spoke with the patient who reports elevated BP readings yesterday when she was at an event hosted by a nurse from [place] giving a talk on high blood pressure. She states that once she got home later that day she drank some fluids as she does not drink many fluids often. She states that her blood pressure did come down to 149/62 and then 143/54. She states that her home BP cuff does not always work correctly and was told by [facility] that they would give her a new one if she got a prescription from her cardiologist. Patient also reports that she does not think she is taking her BP medicine correctly. She gets confused about what time to take what medications. I have printed out a list of her medications with times to take along with an Rx for a BP cuff and left these at the front desk for the patient to pick up today. Once patient receives her new BP monitor she will take her BP for a week and call us with those readings.

## 2022-03-08 NOTE — Telephone Encounter (Signed)
Daughter called stating she had received call from RN and requested her mother be contacted directly regarding her elevated BP.

## 2022-03-08 NOTE — Telephone Encounter (Signed)
    Pt c/o BP issue: STAT if pt c/o blurred vision, one-sided weakness or slurred speech  1. What are your last 5 BP readings?  190/69   196/87 187/80   2. Are you having any other symptoms (ex. Dizziness, headache, blurred vision, passed out)?   3. What is your BP issue? Pt states she takes a bp medicine and she is not sure the name at the moment. She states that her bp is still high.   Pt also states that her bp monitor is not working and needs a new one. She wants to know if she can get it today. She states that her insurance should cover it.   Pt states she will not be home and ask that you call her mobile. If she does not answer please a detailed message.

## 2022-03-09 NOTE — Telephone Encounter (Signed)
Returned call to patient and reviewed medication list with instruction on when to take Levothyroxine.  Advised patient that it is best to take Levothyroxine on an empty stomach in the morning to allow absorption of medication.  Patient verbalized understanding, then asked about when she should take her BP medication Lisinopril. Advised patient that this medication is usually taken in the morning, and may be taken with food. Patient asked when she should check her BP at home with the new cuff she received. Instructed patient to check BP 2-4 hours after taking Lisinopril for more accurate, consistent reading. Patient verbalized understanding.   Patient will monitor BP at home and report results to our office in 1 week.

## 2022-03-09 NOTE — Telephone Encounter (Signed)
Pt returning a call from Alvin Critchley, RN. She ask that you leave her a message about how to properly take medication on her VM because she has another errand to run and will not be home. She states she just wants to clarify the empty stomach part of the instructions.

## 2022-03-09 NOTE — Telephone Encounter (Signed)
Pt called stating yesterday she was told to take her levothyroxine at bedtime, however the bottle says take it on an empty stomach and by that time she has already eaten. She ask would it be better for her to take in the morning before she eats.  Please clarify.

## 2022-03-10 ENCOUNTER — Telehealth: Payer: Self-pay | Admitting: Cardiology

## 2022-03-10 NOTE — Telephone Encounter (Signed)
Pt c/o medication issue:  1. Name of Medication:  sucralfate (CARAFATE) 1 g tablet  2. How are you currently taking this medication (dosage and times per day)?   3. Are you having a reaction (difficulty breathing--STAT)?   4. What is your medication issue?   Patient called in to inform Carly, RN that she no longer takes this medication and she would like to have her pharmacy contacted so that they can remove it from her medication list.

## 2022-03-10 NOTE — Telephone Encounter (Signed)
Medication not prescribed by Dr Mayford Knife.  I spoke with patient and explained to her this was not prescribed by Dr Mayford Knife and that it is  for her stomach. I let her know I could not contact pharmacy to have it removed from her list.  She will contact ordering provider and make them aware she is no longer taking.

## 2022-04-26 DIAGNOSIS — H35373 Puckering of macula, bilateral: Secondary | ICD-10-CM | POA: Diagnosis not present

## 2022-04-26 DIAGNOSIS — Z961 Presence of intraocular lens: Secondary | ICD-10-CM | POA: Diagnosis not present

## 2022-04-26 DIAGNOSIS — H52203 Unspecified astigmatism, bilateral: Secondary | ICD-10-CM | POA: Diagnosis not present

## 2022-04-26 DIAGNOSIS — H524 Presbyopia: Secondary | ICD-10-CM | POA: Diagnosis not present

## 2022-05-11 NOTE — Progress Notes (Signed)
Patient here today for Bp screening. Bp was elevated in Left arm And right arm. Patient states her Bp should stay with 140/80

## 2022-05-23 DIAGNOSIS — Z23 Encounter for immunization: Secondary | ICD-10-CM | POA: Diagnosis not present

## 2022-07-10 DIAGNOSIS — R413 Other amnesia: Secondary | ICD-10-CM | POA: Diagnosis not present

## 2022-07-10 DIAGNOSIS — Z1331 Encounter for screening for depression: Secondary | ICD-10-CM | POA: Diagnosis not present

## 2022-07-10 DIAGNOSIS — N1831 Chronic kidney disease, stage 3a: Secondary | ICD-10-CM | POA: Diagnosis not present

## 2022-07-10 DIAGNOSIS — E7800 Pure hypercholesterolemia, unspecified: Secondary | ICD-10-CM | POA: Diagnosis not present

## 2022-07-10 DIAGNOSIS — Z79899 Other long term (current) drug therapy: Secondary | ICD-10-CM | POA: Diagnosis not present

## 2022-07-10 DIAGNOSIS — I2510 Atherosclerotic heart disease of native coronary artery without angina pectoris: Secondary | ICD-10-CM | POA: Diagnosis not present

## 2022-07-10 DIAGNOSIS — M810 Age-related osteoporosis without current pathological fracture: Secondary | ICD-10-CM | POA: Diagnosis not present

## 2022-07-10 DIAGNOSIS — Z23 Encounter for immunization: Secondary | ICD-10-CM | POA: Diagnosis not present

## 2022-07-10 DIAGNOSIS — Z0000 Encounter for general adult medical examination without abnormal findings: Secondary | ICD-10-CM | POA: Diagnosis not present

## 2022-07-10 DIAGNOSIS — K219 Gastro-esophageal reflux disease without esophagitis: Secondary | ICD-10-CM | POA: Diagnosis not present

## 2022-07-10 DIAGNOSIS — E039 Hypothyroidism, unspecified: Secondary | ICD-10-CM | POA: Diagnosis not present

## 2022-07-12 ENCOUNTER — Other Ambulatory Visit: Payer: Self-pay | Admitting: Family Medicine

## 2022-07-12 DIAGNOSIS — R413 Other amnesia: Secondary | ICD-10-CM

## 2022-07-31 ENCOUNTER — Ambulatory Visit
Admission: RE | Admit: 2022-07-31 | Discharge: 2022-07-31 | Disposition: A | Discharge status: Home / Self Care | Payer: Medicare PPO | Source: Ambulatory Visit | Attending: Family Medicine | Admitting: Family Medicine

## 2022-07-31 DIAGNOSIS — R413 Other amnesia: Secondary | ICD-10-CM

## 2022-07-31 DIAGNOSIS — I6782 Cerebral ischemia: Secondary | ICD-10-CM | POA: Diagnosis not present

## 2022-07-31 DIAGNOSIS — G319 Degenerative disease of nervous system, unspecified: Secondary | ICD-10-CM | POA: Diagnosis not present

## 2022-08-02 ENCOUNTER — Other Ambulatory Visit: Payer: Medicare PPO

## 2022-10-04 DIAGNOSIS — G3184 Mild cognitive impairment, so stated: Secondary | ICD-10-CM | POA: Diagnosis not present

## 2022-10-06 ENCOUNTER — Other Ambulatory Visit (HOSPITAL_COMMUNITY): Payer: Medicare PPO

## 2022-10-09 ENCOUNTER — Telehealth: Payer: Self-pay | Admitting: Cardiology

## 2022-10-09 DIAGNOSIS — I272 Pulmonary hypertension, unspecified: Secondary | ICD-10-CM

## 2022-10-09 DIAGNOSIS — I2510 Atherosclerotic heart disease of native coronary artery without angina pectoris: Secondary | ICD-10-CM

## 2022-10-09 DIAGNOSIS — I77810 Thoracic aortic ectasia: Secondary | ICD-10-CM

## 2022-10-09 DIAGNOSIS — I351 Nonrheumatic aortic (valve) insufficiency: Secondary | ICD-10-CM

## 2022-10-09 NOTE — Telephone Encounter (Signed)
Continuing from previous note:  Patient stated she will not be home after 1:30 pm and can leave slow, detailed message regarding order for Echo test.

## 2022-10-09 NOTE — Telephone Encounter (Signed)
Patient will need new order for Echo test to be rescheduled.

## 2022-10-09 NOTE — Telephone Encounter (Signed)
Echocardiogram has been ordered

## 2022-11-03 ENCOUNTER — Ambulatory Visit (HOSPITAL_COMMUNITY): Payer: Medicare PPO | Attending: Cardiology

## 2022-11-03 DIAGNOSIS — I2510 Atherosclerotic heart disease of native coronary artery without angina pectoris: Secondary | ICD-10-CM | POA: Diagnosis not present

## 2022-11-03 DIAGNOSIS — I272 Pulmonary hypertension, unspecified: Secondary | ICD-10-CM | POA: Insufficient documentation

## 2022-11-03 DIAGNOSIS — I77810 Thoracic aortic ectasia: Secondary | ICD-10-CM | POA: Diagnosis not present

## 2022-11-03 DIAGNOSIS — I351 Nonrheumatic aortic (valve) insufficiency: Secondary | ICD-10-CM | POA: Diagnosis not present

## 2022-11-04 LAB — ECHOCARDIOGRAM COMPLETE
AR max vel: 2.35 cm2
AV Area VTI: 2.42 cm2
AV Area mean vel: 2.25 cm2
AV Mean grad: 11 mmHg
AV Peak grad: 21.9 mmHg
Ao pk vel: 2.34 m/s
Area-P 1/2: 3.22 cm2
Est EF: >75
MV M vel: 6.08 m/s
MV Peak grad: 147.9 mmHg
P 1/2 time: 614 msec
S' Lateral: 1.8 cm

## 2022-11-06 ENCOUNTER — Encounter: Payer: Self-pay | Admitting: Cardiology

## 2022-11-08 ENCOUNTER — Telehealth: Payer: Self-pay

## 2022-11-08 DIAGNOSIS — I351 Nonrheumatic aortic (valve) insufficiency: Secondary | ICD-10-CM

## 2022-11-08 DIAGNOSIS — I272 Pulmonary hypertension, unspecified: Secondary | ICD-10-CM

## 2022-11-08 NOTE — Telephone Encounter (Signed)
-----   Message from Sueanne Margarita, MD sent at 11/06/2022  2:45 PM EDT ----- Echo showed hyperdynamic LVF with mildly thickened heart muscle, enlarged right and left upper chambers of heart, mildly leaky MV, mild to moderate leakiness of TV, moderate leakiness of AV with calcified AV and mild elevation in the pressure in vessels in her lungs called pulmonary HTN (PASP around 42-46mmHg).  Repeat echo in 1 year for pulmonary HTN and AI which are stable compared to a year ago.

## 2022-11-08 NOTE — Telephone Encounter (Signed)
Called patient to discuss echo results. Spoke with patient and daughter about mildly thickened heart muscle, enlarged right and left upper chambers of heart, mildly leaky MV, mild to moderate leakiness of TV, moderate leakiness of AV with calcified AV. Discussed mild elevation in the pressure in vessels in her lungs called pulmonary HTN. Patient agrees to repeat echo in one year, orders placed. Patient with recall for April, scheduled appointment for June which was soonest available.

## 2023-01-18 DIAGNOSIS — Z6821 Body mass index (BMI) 21.0-21.9, adult: Secondary | ICD-10-CM | POA: Diagnosis not present

## 2023-01-18 DIAGNOSIS — I10 Essential (primary) hypertension: Secondary | ICD-10-CM | POA: Diagnosis not present

## 2023-01-18 DIAGNOSIS — N1831 Chronic kidney disease, stage 3a: Secondary | ICD-10-CM | POA: Diagnosis not present

## 2023-01-18 DIAGNOSIS — E7800 Pure hypercholesterolemia, unspecified: Secondary | ICD-10-CM | POA: Diagnosis not present

## 2023-01-18 DIAGNOSIS — M810 Age-related osteoporosis without current pathological fracture: Secondary | ICD-10-CM | POA: Diagnosis not present

## 2023-01-18 DIAGNOSIS — E039 Hypothyroidism, unspecified: Secondary | ICD-10-CM | POA: Diagnosis not present

## 2023-01-18 DIAGNOSIS — I2510 Atherosclerotic heart disease of native coronary artery without angina pectoris: Secondary | ICD-10-CM | POA: Diagnosis not present

## 2023-01-18 DIAGNOSIS — F03A0 Unspecified dementia, mild, without behavioral disturbance, psychotic disturbance, mood disturbance, and anxiety: Secondary | ICD-10-CM | POA: Diagnosis not present

## 2023-01-18 DIAGNOSIS — R634 Abnormal weight loss: Secondary | ICD-10-CM | POA: Diagnosis not present

## 2023-02-01 ENCOUNTER — Encounter: Payer: Self-pay | Admitting: Cardiology

## 2023-02-01 ENCOUNTER — Ambulatory Visit: Payer: Medicare PPO | Attending: Cardiology | Admitting: Cardiology

## 2023-02-01 VITALS — BP 110/80 | HR 60 | Ht 64.0 in | Wt 129.8 lb

## 2023-02-01 DIAGNOSIS — I351 Nonrheumatic aortic (valve) insufficiency: Secondary | ICD-10-CM | POA: Diagnosis not present

## 2023-02-01 DIAGNOSIS — I77810 Thoracic aortic ectasia: Secondary | ICD-10-CM

## 2023-02-01 DIAGNOSIS — I272 Pulmonary hypertension, unspecified: Secondary | ICD-10-CM | POA: Diagnosis not present

## 2023-02-01 DIAGNOSIS — E785 Hyperlipidemia, unspecified: Secondary | ICD-10-CM | POA: Diagnosis not present

## 2023-02-01 DIAGNOSIS — I10 Essential (primary) hypertension: Secondary | ICD-10-CM | POA: Diagnosis not present

## 2023-02-01 DIAGNOSIS — I2510 Atherosclerotic heart disease of native coronary artery without angina pectoris: Secondary | ICD-10-CM

## 2023-02-01 NOTE — Addendum Note (Signed)
Addended by: Luellen Pucker on: 02/01/2023 04:10 PM   Modules accepted: Orders

## 2023-02-01 NOTE — Progress Notes (Signed)
Cardiology Office Note:    Date:  02/01/2023   ID:  Kimberly [name], DOB 26-Jan-1931, MRN [id number]  PCP:  Clayborn Heron, MD  Cardiologist:  Armanda Magic, MD    Referring MD: Clayborn Heron, MD   Chief Complaint  Patient presents with   Coronary Artery Disease   Mitral Regurgitation   Aortic Insuffiency   Hypertension   Hyperlipidemia    History of Present Illness:    Kimberly [name] is a 89 y.o. female with a hx of CAD s/p remote CABG in 2007, mildly dilated ascending aorta, moderate eccentric aortic insufficiency, dyslipidemia (followed by PCP), HTN, arthritis, GERD, hiatal hernia, hypothyroidism, pulmonary HTN and chronic chest pain at night related to GERD.    *Nuclear stress test 2021 was normal, EF 62%.  *2D echo 09/2021 showed EF 60 to 65%, severe left atrial enlargement, mild MR, moderate eccentric AI.   *2D echo 09/2022 showed EF >75%, severe left atrial enlargement, mild MR, moderate AI, ascending aorta 38 mm  She is here today for followup and is doing well.  She denies any chest pain or pressure, SOB, DOE, PND, orthopnea, LE edema, dizziness, palpitations or syncope. She is compliant with her meds and is tolerating meds with no SE.    Past Medical History:  Diagnosis Date   Aortic regurgitation 04/30/2014   Moderate by echo 10/2022   Aortic stenosis    mild by echo 2018 and normal gradient byecho 11/2019   Arthritis    Ascending aorta dilatation (HCC)    ascending aortic root was 36mm on echo 11/2019   Benign essential HTN    Complication of anesthesia    SLOW TO WAKE UP   Coronary artery disease    S/P CABG 2007   Dyslipidemia    GERD (gastroesophageal reflux disease)    H/O hiatal hernia    w distal esophageal ulceration   Hyperlipidemia    Hypertension    Hypothyroidism    Neuromuscular disorder (HCC)    Osteoporosis    Pulmonary HTN (HCC)    42-7mmHg by echo 10/2022   Vertigo     Past Surgical History:  Procedure Laterality Date   BACK  SURGERY  28 YRS AGO   CARDIAC CATHETERIZATION     CORONARY ARTERY BYPASS GRAFT  2007   CORONARY ARTERY BYPASS GRAFT  12/2005   ESOPHAGOGASTRODUODENOSCOPY     Multiple   FLEXIBLE SIGMOIDOSCOPY  09/1998   LAMINECTOMY  10/23/2011   LUMBAR LAMINECTOMY/DECOMPRESSION MICRODISCECTOMY  10/23/2011   Procedure: LUMBAR LAMINECTOMY/DECOMPRESSION MICRODISCECTOMY 1 LEVEL;  Surgeon: Temple Pacini, MD;  Location: [facility];  Service: Neurosurgery;  Laterality: Left;  Left Lumbar Four-Five Microdiskectomy/Laminectomy   TONSILLECTOMY      Current Medications: Current Meds  Medication Sig   aspirin EC 81 MG tablet Take 81 mg by mouth every morning. HOLD FOR PROCEDURE   atorvastatin (LIPITOR) 80 MG tablet Take 1 tablet (80 mg total) by mouth daily.   Calcium Carbonate-Vitamin D (CALCIUM + D PO) Take 1 tablet by mouth daily.    donepezil (ARICEPT) 10 MG tablet Take 10 mg by mouth daily.   ezetimibe (ZETIA) 10 MG tablet Take 1 tablet (10 mg total) by mouth daily.   levothyroxine (SYNTHROID, LEVOTHROID) 50 MCG tablet Take 50 mcg by mouth at bedtime.   lisinopril (PRINIVIL,ZESTRIL) 5 MG tablet Take 5 mg by mouth daily.   Multiple Vitamin (MULITIVITAMIN WITH MINERALS) TABS Take 1 tablet by mouth every morning.   pantoprazole (  PROTONIX) 20 MG tablet Take by mouth daily at 6 (six) AM.   sucralfate (CARAFATE) 1 g tablet Take by mouth daily at 6 (six) AM.   vitamin B-12 (CYANOCOBALAMIN) 1000 MCG tablet Take 1,000 mcg by mouth daily.     Allergies:   Amoxicillin   Social History   Socioeconomic History   Marital status: Widowed    Spouse name: Not on file   Number of children: Not on file   Years of education: Not on file   Highest education level: Not on file  Occupational History   Not on file  Tobacco Use   Smoking status: Never   Smokeless tobacco: Never  Vaping Use   Vaping Use: Never used  Substance and Sexual Activity   Alcohol use: No   Drug use: No   Sexual activity: Not Currently    Birth  control/protection: Post-menopausal  Other Topics Concern   Not on file  Social History Narrative   Not on file   Social Determinants of Health   Financial Resource Strain: Not on file  Food Insecurity: Not on file  Transportation Needs: Not on file  Physical Activity: Not on file  Stress: Not on file  Social Connections: Not on file     Family History: The patient's family history includes Cancer in her father; Heart disease in her mother; High Cholesterol in her sister.  ROS:   Please see the history of present illness.    ROS  All other systems reviewed and negative.   EKGs/Labs/Other Studies Reviewed:   The following studies were reviewed today: Lexiscan Myoview 11/2019 Study Highlights  EKG without significant ST changes to suggest ischemia Myovue with normal perfusion. No ischemia or scar LVEF 70% This is a low risk study.   2D echo 11/2019 IMPRESSIONS    1. Left ventricular ejection fraction, by estimation, is 60 to 65%. The  left ventricle has normal function. The left ventricle has no regional  wall motion abnormalities. Left ventricular diastolic parameters are  consistent with Grade II diastolic  dysfunction (pseudonormalization).   2. Right ventricular systolic function is normal. The right ventricular  size is normal. There is mildly elevated pulmonary artery systolic  pressure. The estimated right ventricular systolic pressure is 38.5 mmHg.   3. Left atrial size was mildly dilated.   4. The mitral valve is normal in structure. Mild mitral valve  regurgitation. No evidence of mitral stenosis.   5. The aortic valve is tricuspid. Aortic valve regurgitation is moderate.  Mild to moderate aortic valve sclerosis/calcification is present, without  any evidence of aortic stenosis. Aortic valve mean gradient measures 8.0  mmHg.   6. The inferior vena cava is normal in size with <50% respiratory  variability, suggesting right atrial pressure of 8 mmHg.         Recent Labs: No results found for requested labs within last 365 days.   Recent Lipid Panel    Component Value Date/Time   CHOL 127 01/02/2022 0837   TRIG 45 01/02/2022 0837   HDL 70 01/02/2022 0837   CHOLHDL 1.8 01/02/2022 0837   CHOLHDL 3 05/27/2014 0845   VLDL 13.4 05/27/2014 0845   LDLCALC 46 01/02/2022 0837    Physical Exam:    VS:  Ht 5\' 4"  (1.626 m)   Wt 129 lb 12.8 oz (58.9 kg)   BMI 22.28 kg/m     Wt Readings from Last 3 Encounters:  02/01/23 129 lb 12.8 oz (58.9 kg)  12/09/21 135  lb (61.2 kg)  11/03/20 140 lb (63.5 kg)    GEN: Well nourished, well developed in no acute distress HEENT: Normal NECK: No JVD; No carotid bruits LYMPHATICS: No lymphadenopathy CARDIAC:RRR, no rubs, gallops.  2/6 DM at RUSB  RESPIRATORY:  Clear to auscultation without rales, wheezing or rhonchi  ABDOMEN: Soft, non-tender, non-distended MUSCULOSKELETAL:  No edema; No deformity  SKIN: Warm and dry NEUROLOGIC:  Alert and oriented x 3 PSYCHIATRIC:  Normal affect  ASSESSMENT:    1. Atherosclerosis of native coronary artery of native heart without angina pectoris   2. Nonrheumatic aortic valve insufficiency   3. Pulmonary HTN (HCC)   4. Mild dilation of ascending aorta (HCC)   5. Benign essential HTN   6. Dyslipidemia    PLAN:    In order of problems listed above:  1.  ASCAD -s/p remote CABG -Her last Lexiscan Myoview in 2021 showed no ischemia -She has had several years of intermittent chest discomfort that only occurs at night and not with exertion and is felt to be related to GERD and the pain has resolved -She denies any exertional angina since I saw her last -Continue prescription drug management with aspirin 81 mg daily, atorvastatin 80 mg daily with as needed refills  2.  Aortic Insufficency/Mitral Insufficiency -2D echo 09/2021 showed moderate AI which was eccentric -Repeat 2D echo 09/2022 with moderate AI and no AS and mild MR -Repeat 2D echo 09/2023  3.   Pulmonary HTN -very mild with PASP by echo 2021 and no pulmonary hypertension by echo 09/2021  4.  Dilated ascending aorta -37mm by echo 2018 and 2021 and normal by echo 2023 -Echo 2024 measuring 38 mm  5.  HTN -BP is well-controlled on exam today -Continue drug management with lisinopril 5 mg daily with PRN Refills -I have personally reviewed and interpreted outside labs performed by patient's PCP which showed serum creatinine 0.98 and potassium 4.4 on 07/04/2021  6.  HLD -LDL goal < 70 -I will get a copy of her FLP from PCP -Continue prescription drug management with atorvastatin 80 mg daily and Zetia 10 mg daily with as needed refills  Medication Adjustments/Labs and Tests Ordered: Current medicines are reviewed at length with the patient today.  Concerns regarding medicines are outlined above.  Orders Placed This Encounter  Procedures   EKG 12-Lead   No orders of the defined types were placed in this encounter.   Signed, Armanda Magic, MD  02/01/2023 3:47 PM     [facility]

## 2023-02-01 NOTE — Addendum Note (Signed)
Addended by: Luellen Pucker on: 02/01/2023 04:04 PM   Modules accepted: Orders

## 2023-02-01 NOTE — Patient Instructions (Addendum)
Medication Instructions:  Your physician recommends that you continue on your current medications as directed. Please refer to the Current Medication list given to you today.  *If you need a refill on your cardiac medications before your next appointment, please call your pharmacy*   Lab Work: Please have your PCP send [place] a copy of your most recent fasting lipid panel.   If you have labs (blood work) drawn today and your tests are completely normal, you will receive your results only by: MyChart Message (if you have MyChart) OR A paper copy in the mail If you have any lab test that is abnormal or we need to change your treatment, we will call you to review the results.   Testing/Procedures: Your physician has requested that you have an echocardiogram in February of 2025. Echocardiography is a painless test that uses sound waves to create images of your heart. It provides your doctor with information about the size and shape of your heart and how well your heart's chambers and valves are working. This procedure takes approximately one hour. There are no restrictions for this procedure. Please do NOT wear cologne, perfume, aftershave, or lotions (deodorant is allowed). Please arrive 15 minutes prior to your appointment time.    Follow-Up: At [facility], you and your health needs are our priority.  As part of our continuing mission to provide you with exceptional heart care, we have created designated Provider Care Teams.  These Care Teams include your primary Cardiologist (physician) and Advanced Practice Providers (APPs -  Physician Assistants and Nurse Practitioners) who all work together to provide you with the care you need, when you need it.  We recommend signing up for the patient portal called "MyChart".  Sign up information is provided on this After Visit Summary.  MyChart is used to connect with patients for Virtual Visits (Telemedicine).  Patients are able to view lab/test  results, encounter notes, upcoming appointments, etc.  Non-urgent messages can be sent to your provider as well.   To learn more about what you can do with MyChart, go to [email or web address].    Your next appointment:   1 year(s)  Provider:   Armanda Magic, MD

## 2023-03-22 DIAGNOSIS — E441 Mild protein-calorie malnutrition: Secondary | ICD-10-CM | POA: Diagnosis not present

## 2023-03-22 DIAGNOSIS — F03A0 Unspecified dementia, mild, without behavioral disturbance, psychotic disturbance, mood disturbance, and anxiety: Secondary | ICD-10-CM | POA: Diagnosis not present

## 2023-03-22 DIAGNOSIS — Z6822 Body mass index (BMI) 22.0-22.9, adult: Secondary | ICD-10-CM | POA: Diagnosis not present

## 2023-05-04 DIAGNOSIS — H04123 Dry eye syndrome of bilateral lacrimal glands: Secondary | ICD-10-CM | POA: Diagnosis not present

## 2023-05-04 DIAGNOSIS — H52203 Unspecified astigmatism, bilateral: Secondary | ICD-10-CM | POA: Diagnosis not present

## 2023-05-04 DIAGNOSIS — H35373 Puckering of macula, bilateral: Secondary | ICD-10-CM | POA: Diagnosis not present

## 2023-05-04 DIAGNOSIS — H524 Presbyopia: Secondary | ICD-10-CM | POA: Diagnosis not present

## 2023-05-04 DIAGNOSIS — H02403 Unspecified ptosis of bilateral eyelids: Secondary | ICD-10-CM | POA: Diagnosis not present

## 2023-05-23 DIAGNOSIS — Z23 Encounter for immunization: Secondary | ICD-10-CM | POA: Diagnosis not present

## 2023-07-16 DIAGNOSIS — Z6822 Body mass index (BMI) 22.0-22.9, adult: Secondary | ICD-10-CM | POA: Diagnosis not present

## 2023-07-16 DIAGNOSIS — Z0000 Encounter for general adult medical examination without abnormal findings: Secondary | ICD-10-CM | POA: Diagnosis not present

## 2023-07-16 DIAGNOSIS — Z23 Encounter for immunization: Secondary | ICD-10-CM | POA: Diagnosis not present

## 2023-07-16 DIAGNOSIS — Z1389 Encounter for screening for other disorder: Secondary | ICD-10-CM | POA: Diagnosis not present

## 2023-07-23 DIAGNOSIS — M810 Age-related osteoporosis without current pathological fracture: Secondary | ICD-10-CM | POA: Diagnosis not present

## 2023-07-23 DIAGNOSIS — I2510 Atherosclerotic heart disease of native coronary artery without angina pectoris: Secondary | ICD-10-CM | POA: Diagnosis not present

## 2023-07-23 DIAGNOSIS — I700 Atherosclerosis of aorta: Secondary | ICD-10-CM | POA: Diagnosis not present

## 2023-07-23 DIAGNOSIS — Z79899 Other long term (current) drug therapy: Secondary | ICD-10-CM | POA: Diagnosis not present

## 2023-07-23 DIAGNOSIS — E441 Mild protein-calorie malnutrition: Secondary | ICD-10-CM | POA: Diagnosis not present

## 2023-07-23 DIAGNOSIS — E039 Hypothyroidism, unspecified: Secondary | ICD-10-CM | POA: Diagnosis not present

## 2023-07-23 DIAGNOSIS — I10 Essential (primary) hypertension: Secondary | ICD-10-CM | POA: Diagnosis not present

## 2023-07-23 DIAGNOSIS — Z6821 Body mass index (BMI) 21.0-21.9, adult: Secondary | ICD-10-CM | POA: Diagnosis not present

## 2023-07-23 DIAGNOSIS — E7800 Pure hypercholesterolemia, unspecified: Secondary | ICD-10-CM | POA: Diagnosis not present

## 2023-07-23 DIAGNOSIS — F03A0 Unspecified dementia, mild, without behavioral disturbance, psychotic disturbance, mood disturbance, and anxiety: Secondary | ICD-10-CM | POA: Diagnosis not present

## 2023-09-26 ENCOUNTER — Ambulatory Visit (HOSPITAL_COMMUNITY): Payer: Medicare PPO | Attending: Internal Medicine

## 2023-09-26 ENCOUNTER — Encounter: Payer: Self-pay | Admitting: Cardiology

## 2023-09-26 DIAGNOSIS — I351 Nonrheumatic aortic (valve) insufficiency: Secondary | ICD-10-CM | POA: Diagnosis not present

## 2023-09-26 LAB — ECHOCARDIOGRAM COMPLETE
AR max vel: 3.11 cm2
AV Area VTI: 2.94 cm2
AV Area mean vel: 2.9 cm2
AV Mean grad: 7 mm[Hg]
AV Peak grad: 14 mm[Hg]
Ao pk vel: 1.87 m/s
Area-P 1/2: 3.19 cm2
P 1/2 time: 743 ms
S' Lateral: 2.8 cm

## 2023-09-28 ENCOUNTER — Telehealth: Payer: Self-pay

## 2023-09-28 NOTE — Telephone Encounter (Signed)
Call to patient to discuss echo results. Called [phone] which is listed on DPR to leave messages, unfortunately no VM picked up and no answer. Called alternate # and left message for recipient asking them to call [place] at our office #.

## 2023-09-28 NOTE — Telephone Encounter (Signed)
-----   Message from Armanda Magic sent at 09/26/2023  4:03 PM EST ----- Echo showed normal heart function EF 60-65% with some thickening of the heart muscle , moderately enlarged LA, trivial leakiness of the MV and moderate leakiness of the AV - repeat echo in 1 year for AI

## 2023-10-01 ENCOUNTER — Telehealth: Payer: Self-pay

## 2023-10-01 DIAGNOSIS — I351 Nonrheumatic aortic (valve) insufficiency: Secondary | ICD-10-CM

## 2023-10-01 NOTE — Telephone Encounter (Signed)
-----   Message from Armanda Magic sent at 09/26/2023  4:03 PM EST ----- Echo showed normal heart function EF 60-65% with some thickening of the heart muscle , moderately enlarged LA, trivial leakiness of the MV and moderate leakiness of the AV - repeat echo in 1 year for AI

## 2023-10-01 NOTE — Telephone Encounter (Signed)
Call from patient's dtr (DPR)  to discuss echo results. Explained that Echo showed normal heart function EF 60-65% with some thickening of the heart muscle , moderately enlarged LA, trivial leakiness of the MV and moderate leakiness of the AV. Dtr agrees to repeat echo in 1 year for AI.   Dtr also requesting appt for her mother as patient is "worried about her condition and prognosis since bypass surgery and wants to see Dr. Mayford Knife to reassure herself she is ok". Appt made for April.

## 2023-11-22 ENCOUNTER — Encounter: Payer: Self-pay | Admitting: Cardiology

## 2023-11-22 ENCOUNTER — Ambulatory Visit: Payer: Medicare PPO | Attending: Cardiology | Admitting: Cardiology

## 2023-11-22 VITALS — BP 128/62 | HR 59 | Ht 64.0 in | Wt 124.4 lb

## 2023-11-22 DIAGNOSIS — I351 Nonrheumatic aortic (valve) insufficiency: Secondary | ICD-10-CM | POA: Diagnosis not present

## 2023-11-22 DIAGNOSIS — I10 Essential (primary) hypertension: Secondary | ICD-10-CM

## 2023-11-22 DIAGNOSIS — I2510 Atherosclerotic heart disease of native coronary artery without angina pectoris: Secondary | ICD-10-CM | POA: Diagnosis not present

## 2023-11-22 DIAGNOSIS — I272 Pulmonary hypertension, unspecified: Secondary | ICD-10-CM | POA: Diagnosis not present

## 2023-11-22 DIAGNOSIS — E785 Hyperlipidemia, unspecified: Secondary | ICD-10-CM

## 2023-11-22 DIAGNOSIS — I77810 Thoracic aortic ectasia: Secondary | ICD-10-CM | POA: Diagnosis not present

## 2023-11-22 NOTE — Patient Instructions (Signed)
 Medication Instructions:   *If you need a refill on your cardiac medications before your next appointment, please call your pharmacy*  Lab Work:  If you have labs (blood work) drawn today and your tests are completely normal, you will receive your results only by: MyChart Message (if you have MyChart) OR A paper copy in the mail If you have any lab test that is abnormal or we need to change your treatment, we will call you to review the results.  Testing/Procedures: Feb 2026 Your physician has requested that you have an echocardiogram. Echocardiography is a painless test that uses sound waves to create images of your heart. It provides your doctor with information about the size and shape of your heart and how well your heart's chambers and valves are working. This procedure takes approximately one hour. There are no restrictions for this procedure. Please do NOT wear cologne, perfume, aftershave, or lotions (deodorant is allowed). Please arrive 15 minutes prior to your appointment time.  Please note: We ask at that you not bring children with you during ultrasound (echo/ vascular) testing. Due to room size and safety concerns, children are not allowed in the ultrasound rooms during exams. Our front office staff cannot provide observation of children in our lobby area while testing is being conducted. An adult accompanying a patient to their appointment will only be allowed in the ultrasound room at the discretion of the ultrasound technician under special circumstances. We apologize for any inconvenience.   Follow-Up: At [facility], you and your health needs are our priority.  As part of our continuing mission to provide you with exceptional heart care, our providers are all part of one team.  This team includes your primary Cardiologist (physician) and Advanced Practice Providers or APPs (Physician Assistants and Nurse Practitioners) who all work together to provide you with the care  you need, when you need it.  Your next appointment:  one year  We recommend signing up for the patient portal called "MyChart".  Sign up information is provided on this After Visit Summary.  MyChart is used to connect with patients for Virtual Visits (Telemedicine).  Patients are able to view lab/test results, encounter notes, upcoming appointments, etc.  Non-urgent messages can be sent to your provider as well.   To learn more about what you can do with MyChart, go to [email or web address].   Other Instructions       1st Floor: - Lobby - Registration  - Pharmacy  - Lab - Cafe  2nd Floor: - PV Lab - Diagnostic Testing (echo, CT, nuclear med)  3rd Floor: - Vacant  4th Floor: - TCTS (cardiothoracic surgery) - AFib Clinic - Structural Heart Clinic - Vascular Surgery  - Vascular Ultrasound  5th Floor: - [facility] (general and EP) - Clinical Pharmacy for coumadin, hypertension, lipid, weight-loss medications, and med management appointments    Valet parking services will be available as well.

## 2023-11-22 NOTE — Progress Notes (Signed)
 Cardiology Office Note:    Date:  11/22/2023   ID:  Kimberly [name], DOB 01-12-1931, MRN [id number]  PCP:  Kimberly Heron, MD  Cardiologist:  Kimberly Magic, MD    Referring MD: Kimberly Heron, MD   Chief Complaint  Patient presents with   Coronary Artery Disease   Hypertension   Hyperlipidemia   Aortic Insuffiency    History of Present Illness:    Kimberly [name] is a 89 y.o. female with a hx of CAD s/p remote CABG in 2007, mildly dilated ascending aorta, moderate eccentric aortic insufficiency, dyslipidemia (followed by PCP), HTN, arthritis, GERD, hiatal hernia, hypothyroidism, pulmonary HTN and chronic chest pain at night related to GERD.    *Nuclear stress test 2021 was normal, EF 62%.  *2D echo 09/2021 showed EF 60 to 65%, severe left atrial enlargement, mild MR, moderate eccentric AI.   *2D echo 09/2022 showed EF >75%, severe left atrial enlargement, mild MR, moderate AI, ascending aorta 38 mm *2D echo 09/2023 showed EF 60-65%, moderate LAE, trivial MR, moderate AI  She is here today for followup and is doing well.  She denies any chest pain or pressure, SOB, DOE, PND, orthopnea, LE edema, dizziness, palpitations or syncope. She is compliant with her meds and is tolerating meds with no SE.    Past Medical History:  Diagnosis Date   Aortic regurgitation 04/30/2014   Moderate by echo 09/2023   Aortic stenosis    mild by echo 2018 and normal gradient byecho 11/2019   Arthritis    Ascending aorta dilatation (HCC)    ascending aortic root was 36mm on echo 11/2019   Benign essential HTN    Complication of anesthesia    SLOW TO WAKE UP   Coronary artery disease    S/P CABG 2007   Dyslipidemia    GERD (gastroesophageal reflux disease)    H/O hiatal hernia    w distal esophageal ulceration   Hyperlipidemia    Hypertension    Hypothyroidism    Neuromuscular disorder (HCC)    Osteoporosis    Pulmonary HTN (HCC)    42-59mmHg by echo 10/2022 and normal PAP on echo  09/2023   Vertigo     Past Surgical History:  Procedure Laterality Date   BACK SURGERY  28 YRS AGO   CARDIAC CATHETERIZATION     CORONARY ARTERY BYPASS GRAFT  2007   CORONARY ARTERY BYPASS GRAFT  12/2005   ESOPHAGOGASTRODUODENOSCOPY     Multiple   FLEXIBLE SIGMOIDOSCOPY  09/1998   LAMINECTOMY  10/23/2011   LUMBAR LAMINECTOMY/DECOMPRESSION MICRODISCECTOMY  10/23/2011   Procedure: LUMBAR LAMINECTOMY/DECOMPRESSION MICRODISCECTOMY 1 LEVEL;  Surgeon: Temple Pacini, MD;  Location: [facility];  Service: Neurosurgery;  Laterality: Left;  Left Lumbar Four-Five Microdiskectomy/Laminectomy   TONSILLECTOMY      Current Medications: Current Meds  Medication Sig   aspirin EC 81 MG tablet Take 81 mg by mouth every morning. HOLD FOR PROCEDURE   atorvastatin (LIPITOR) 80 MG tablet Take 1 tablet (80 mg total) by mouth daily.   Calcium Carbonate-Vitamin D (CALCIUM + D PO) Take 1 tablet by mouth daily.    donepezil (ARICEPT) 10 MG tablet Take 10 mg by mouth daily.   ezetimibe (ZETIA) 10 MG tablet Take 1 tablet (10 mg total) by mouth daily.   levothyroxine (SYNTHROID, LEVOTHROID) 50 MCG tablet Take 50 mcg by mouth at bedtime.   lisinopril (PRINIVIL,ZESTRIL) 5 MG tablet Take 5 mg by mouth daily.   Multiple Vitamin (  MULITIVITAMIN WITH MINERALS) TABS Take 1 tablet by mouth every morning.   pantoprazole (PROTONIX) 20 MG tablet Take by mouth daily at 6 (six) AM.   sucralfate (CARAFATE) 1 g tablet Take by mouth daily at 6 (six) AM.   vitamin B-12 (CYANOCOBALAMIN) 1000 MCG tablet Take 1,000 mcg by mouth daily.     Allergies:   Amoxicillin   Social History   Socioeconomic History   Marital status: Widowed    Spouse name: Not on file   Number of children: Not on file   Years of education: Not on file   Highest education level: Not on file  Occupational History   Not on file  Tobacco Use   Smoking status: Never   Smokeless tobacco: Never  Vaping Use   Vaping status: Never Used  Substance and Sexual  Activity   Alcohol use: No   Drug use: No   Sexual activity: Not Currently    Birth control/protection: Post-menopausal  Other Topics Concern   Not on file  Social History Narrative   Not on file   Social Drivers of Health   Financial Resource Strain: Not on file  Food Insecurity: Not on file  Transportation Needs: Not on file  Physical Activity: Not on file  Stress: Not on file  Social Connections: Not on file     Family History: The patient's family history includes Cancer in her father; Heart disease in her mother; High Cholesterol in her sister.  ROS:   Please see the history of present illness.    ROS  All other systems reviewed and negative.   EKGs/Labs/Other Studies Reviewed:   The following studies were reviewed today: Lexiscan Myoview 11/2019 Study Highlights  EKG without significant ST changes to suggest ischemia Myovue with normal perfusion. No ischemia or scar LVEF 70% This is a low risk study.   2D echo 11/2019 IMPRESSIONS    1. Left ventricular ejection fraction, by estimation, is 60 to 65%. The  left ventricle has normal function. The left ventricle has no regional  wall motion abnormalities. Left ventricular diastolic parameters are  consistent with Grade II diastolic  dysfunction (pseudonormalization).   2. Right ventricular systolic function is normal. The right ventricular  size is normal. There is mildly elevated pulmonary artery systolic  pressure. The estimated right ventricular systolic pressure is 38.5 mmHg.   3. Left atrial size was mildly dilated.   4. The mitral valve is normal in structure. Mild mitral valve  regurgitation. No evidence of mitral stenosis.   5. The aortic valve is tricuspid. Aortic valve regurgitation is moderate.  Mild to moderate aortic valve sclerosis/calcification is present, without  any evidence of aortic stenosis. Aortic valve mean gradient measures 8.0  mmHg.   6. The inferior vena cava is normal in size with  <50% respiratory  variability, suggesting right atrial pressure of 8 mmHg.        Recent Labs: No results found for requested labs within last 365 days.   Recent Lipid Panel    Component Value Date/Time   CHOL 127 01/02/2022 0837   TRIG 45 01/02/2022 0837   HDL 70 01/02/2022 0837   CHOLHDL 1.8 01/02/2022 0837   CHOLHDL 3 05/27/2014 0845   VLDL 13.4 05/27/2014 0845   LDLCALC 46 01/02/2022 0837    Physical Exam:    VS:  BP 128/62   Pulse (!) 59   Ht 5\' 4"  (1.626 m)   Wt 124 lb 6.4 oz (56.4 kg)   SpO2  99%   BMI 21.35 kg/m     Wt Readings from Last 3 Encounters:  11/22/23 124 lb 6.4 oz (56.4 kg)  02/01/23 129 lb 12.8 oz (58.9 kg)  12/09/21 135 lb (61.2 kg)    GEN: Well nourished, well developed in no acute distress HEENT: Normal NECK: No JVD; No carotid bruits LYMPHATICS: No lymphadenopathy CARDIAC:RRR, no rubs, gallops. 1/6 SM at RUSB RESPIRATORY:  Clear to auscultation without rales, wheezing or rhonchi  ABDOMEN: Soft, non-tender, non-distended MUSCULOSKELETAL:  No edema; No deformity  SKIN: Warm and dry NEUROLOGIC:  Alert and oriented x 3 PSYCHIATRIC:  Normal affect  ASSESSMENT:    1. Atherosclerosis of native coronary artery of native heart without angina pectoris   2. Nonrheumatic aortic valve insufficiency   3. Pulmonary HTN (HCC)   4. Mild dilation of ascending aorta (HCC)   5. Benign essential HTN   6. Dyslipidemia     PLAN:    In order of problems listed above:  1.  ASCAD -s/p remote CABG -Her last Lexiscan Myoview in 2021 showed no ischemia -She has had several years of intermittent chest discomfort that only occurs at night and not with exertion and is felt to be related to GERD and the pain has resolved -She has not had any complaints of chest pain since I saw her last -Continue prescription drug managed with aspirin 81 mg daily, Lipitor 80 mg daily, Zetia 10 mg daily with as needed refills  2.  Aortic Insufficency/Mitral Insufficiency -2D  echo 09/2021 showed moderate AI which was eccentric -Repeat 2D echo 09/2022 with moderate AI and no AS and mild MR -Repeat 2D echo 09/2023 with moderate AI and trivial MR -Repeat echo 10/03/2024  3.  Pulmonary HTN -very mild with PASP by echo 2021 and no pulmonary hypertension by echo 09/2021 or 10/04/2023  4.  Dilated ascending aorta -37mm by echo 2018 and 2021 and normal by echo 2023 -Echo 10/04/2023 with normal aortic root and ascending aorta  5.  HTN -BP controlled on exam today -Continue prescription drug management with lisinopril 5 mg daily with as needed refills  6.  HLD -LDL goal < 70 -I have personally reviewed and interpreted outside labs performed by patient's PCP which showed LDL 52, HDL 50 and ALT 21 on 07/23/2023 -Continue prescription drug with atorvastatin 80 mg daily and Zetia 10 mg daily with as needed refills  Medication Adjustments/Labs and Tests Ordered: Current medicines are reviewed at length with the patient today.  Concerns regarding medicines are outlined above.  No orders of the defined types were placed in this encounter.  No orders of the defined types were placed in this encounter.   Signed, Kimberly Magic, MD  11/22/2023 11:21 AM    [facility]

## 2023-12-10 ENCOUNTER — Emergency Department (HOSPITAL_BASED_OUTPATIENT_CLINIC_OR_DEPARTMENT_OTHER)
Admission: EM | Admit: 2023-12-10 | Discharge: 2023-12-10 | Disposition: A | Discharge status: Home / Self Care | Attending: Emergency Medicine | Admitting: Emergency Medicine

## 2023-12-10 ENCOUNTER — Emergency Department (HOSPITAL_BASED_OUTPATIENT_CLINIC_OR_DEPARTMENT_OTHER)

## 2023-12-10 ENCOUNTER — Other Ambulatory Visit: Payer: Self-pay

## 2023-12-10 ENCOUNTER — Encounter (HOSPITAL_BASED_OUTPATIENT_CLINIC_OR_DEPARTMENT_OTHER): Payer: Self-pay

## 2023-12-10 DIAGNOSIS — R531 Weakness: Secondary | ICD-10-CM | POA: Insufficient documentation

## 2023-12-10 DIAGNOSIS — R42 Dizziness and giddiness: Secondary | ICD-10-CM | POA: Diagnosis not present

## 2023-12-10 DIAGNOSIS — Z7982 Long term (current) use of aspirin: Secondary | ICD-10-CM | POA: Diagnosis not present

## 2023-12-10 DIAGNOSIS — R001 Bradycardia, unspecified: Secondary | ICD-10-CM | POA: Diagnosis not present

## 2023-12-10 LAB — COMPREHENSIVE METABOLIC PANEL WITH GFR
ALT: 22 U/L (ref 0–44)
AST: 27 U/L (ref 15–41)
Albumin: 4.6 g/dL (ref 3.5–5.0)
Alkaline Phosphatase: 47 U/L (ref 38–126)
Anion gap: 10 (ref 5–15)
BUN: 21 mg/dL (ref 8–23)
CO2: 26 mmol/L (ref 22–32)
Calcium: 10.8 mg/dL — ABNORMAL HIGH (ref 8.9–10.3)
Chloride: 104 mmol/L (ref 98–111)
Creatinine, Ser: 0.92 mg/dL (ref 0.44–1.00)
GFR, Estimated: 59 mL/min — ABNORMAL LOW (ref >60)
Glucose, Bld: 82 mg/dL (ref 70–99)
Potassium: 4.8 mmol/L (ref 3.5–5.1)
Sodium: 140 mmol/L (ref 135–145)
Total Bilirubin: 0.4 mg/dL (ref 0.0–1.2)
Total Protein: 6.6 g/dL (ref 6.5–8.1)

## 2023-12-10 LAB — TROPONIN T, HIGH SENSITIVITY
Troponin T High Sensitivity: 17 ng/L (ref <19)
Troponin T High Sensitivity: 15 ng/L (ref <19)

## 2023-12-10 LAB — CBC WITH DIFFERENTIAL/PLATELET
Abs Immature Granulocytes: 0.02 10*3/uL (ref 0.00–0.07)
Basophils Absolute: 0.1 10*3/uL (ref 0.0–0.1)
Basophils Relative: 1 %
Eosinophils Absolute: 0.2 10*3/uL (ref 0.0–0.5)
Eosinophils Relative: 3 %
HCT: 42.8 % (ref 36.0–46.0)
Hemoglobin: 13.9 g/dL (ref 12.0–15.0)
Immature Granulocytes: 0 %
Lymphocytes Relative: 22 %
Lymphs Abs: 1.3 10*3/uL (ref 0.7–4.0)
MCH: 28.7 pg (ref 26.0–34.0)
MCHC: 32.5 g/dL (ref 30.0–36.0)
MCV: 88.4 fL (ref 80.0–100.0)
Monocytes Absolute: 0.6 10*3/uL (ref 0.1–1.0)
Monocytes Relative: 10 %
Neutro Abs: 3.8 10*3/uL (ref 1.7–7.7)
Neutrophils Relative %: 64 %
Platelets: 217 10*3/uL (ref 150–400)
RBC: 4.84 MIL/uL (ref 3.87–5.11)
RDW: 13 % (ref 11.5–15.5)
WBC: 5.9 10*3/uL (ref 4.0–10.5)
nRBC: 0 % (ref 0.0–0.2)

## 2023-12-10 LAB — URINALYSIS, ROUTINE W REFLEX MICROSCOPIC
Bilirubin Urine: NEGATIVE
Glucose, UA: NEGATIVE mg/dL
Hgb urine dipstick: NEGATIVE
Ketones, ur: NEGATIVE mg/dL
Leukocytes,Ua: NEGATIVE
Nitrite: NEGATIVE
Protein, ur: NEGATIVE mg/dL
Specific Gravity, Urine: 1.006 (ref 1.005–1.030)
pH: 7 (ref 5.0–8.0)

## 2023-12-10 MED ORDER — LACTATED RINGERS IV BOLUS
1000.0000 mL | Freq: Once | INTRAVENOUS | Status: AC
  Administered 2023-12-10: 1000 mL via INTRAVENOUS

## 2023-12-10 MED ORDER — ACETAMINOPHEN 500 MG PO TABS
1000.0000 mg | ORAL_TABLET | Freq: Once | ORAL | Status: DC
Start: 2023-12-10 — End: 2023-12-10
  Filled 2023-12-10: qty 2

## 2023-12-10 NOTE — ED Provider Notes (Signed)
 Care of patient received from prior provider at 10:22 PM, please see their note for complete H/P and care plan.  Received handoff per ED course.  Clinical Course as of 12/10/23 2222  Mon Dec 10, 2023  1509 Stable 60 YOF with  a chief complaint of weakness Fatigue/AMS workup in process. [CC]  1645 Waiting on urine. [CC]    Clinical Course User Index [CC] Onetha Bile, MD    Reassessment: History of present illness and physical exam findings not consistent with any acute pathology.  Family feels comfortable with ambulatory management.  Disposition:  I have considered need for hospitalization, however, considering all of the above, I believe this patient is stable for discharge at this time.  Patient/family educated about specific return precautions for given chief complaint and symptoms.  Patient/family educated about follow-up with PCP.     Patient/family expressed understanding of return precautions and need for follow-up. Patient spoken to regarding all imaging and laboratory results and appropriate follow up for these results. All education provided in verbal form with additional information in written form. Time was allowed for answering of patient questions. Patient discharged.    Emergency Department Medication Summary:   Medications  acetaminophen  (TYLENOL ) tablet 1,000 mg (1,000 mg Oral Patient Refused/Not Given 12/10/23 1840)  lactated ringers  bolus 1,000 mL (0 mLs Intravenous Stopped 12/10/23 1658)            Onetha Bile, MD 12/10/23 2222

## 2023-12-10 NOTE — ED Provider Notes (Signed)
 [facility] AT [facility] Provider Note   CSN: [id number] Arrival date & time: 12/10/23  1218     History  Chief Complaint  Patient presents with   Weakness   feeling lightheaded    Kimberly [name] is a 89 y.o. female.  Pt indicates has felt intermittently lightheaded and generally weak in past few days. Family member indicates feels does not drink enough fluids. Pt very limited historian - level 5 caveat. Pt denies focal pain. No headache. No neck/back pain. No chest pain. No sob. No abd pain or nvd. No blood loss or rectal bleeding. No dysuria or c/o. No extremity pain/swelling. No recent change in meds. No fever or chills.   The history is provided by the patient, a relative and medical records. The history is limited by the condition of the patient.       Home Medications Prior to Admission medications   Medication Sig Start Date End Date Taking? Authorizing Provider  aspirin  EC 81 MG tablet Take 81 mg by mouth every morning. HOLD FOR PROCEDURE    [provider]  atorvastatin  (LIPITOR) 80 MG tablet Take 1 tablet (80 mg total) by mouth daily. 06/03/14   Jacqueline Matsu, MD  Calcium  Carbonate-Vitamin D (CALCIUM  + D PO) Take 1 tablet by mouth daily.     [provider]  donepezil (ARICEPT) 10 MG tablet Take 10 mg by mouth daily. 01/10/23   [provider]  ezetimibe  (ZETIA ) 10 MG tablet Take 1 tablet (10 mg total) by mouth daily. 01/13/22   Jacqueline Matsu, MD  levothyroxine  (SYNTHROID , LEVOTHROID) 50 MCG tablet Take 50 mcg by mouth at bedtime.    [provider]  lisinopril (PRINIVIL,ZESTRIL) 5 MG tablet Take 5 mg by mouth daily.    [provider]  Multiple Vitamin (MULITIVITAMIN WITH MINERALS) TABS Take 1 tablet by mouth every morning.    [provider]  pantoprazole  (PROTONIX ) 20 MG tablet Take by mouth daily at 6 (six) AM. 07/27/21   [provider]  sucralfate (CARAFATE) 1 g tablet  Take by mouth daily at 6 (six) AM. 08/06/21   [provider]  vitamin B-12 (CYANOCOBALAMIN ) 1000 MCG tablet Take 1,000 mcg by mouth daily.    [provider]      Allergies    Amoxicillin    Review of Systems   Review of Systems  Constitutional:  Negative for fever.  HENT:  Negative for sore throat.   Respiratory:  Negative for cough and shortness of breath.   Cardiovascular:  Negative for chest pain and leg swelling.  Gastrointestinal:  Negative for abdominal pain, blood in stool, diarrhea and vomiting.  Genitourinary:  Negative for dysuria and flank pain.  Musculoskeletal:  Negative for back pain and neck pain.  Neurological:  Positive for light-headedness. Negative for headaches.    Physical Exam Updated Vital Signs BP (!) 188/47 (BP Location: Right Arm)   Pulse (!) 50   Temp 97.9 F (36.6 C)   Resp 16   SpO2 100%  Physical Exam Vitals and nursing note reviewed.  Constitutional:      Appearance: Normal appearance. She is well-developed.  HENT:     Head: Atraumatic.     Nose: Nose normal.     Mouth/Throat:     Mouth: Mucous membranes are moist.  Eyes:     General: No scleral icterus.    Conjunctiva/sclera: Conjunctivae normal.     Pupils: Pupils are equal, round, and  reactive to light.  Neck:     Vascular: No carotid bruit.     Trachea: No tracheal deviation.  Cardiovascular:     Rate and Rhythm: Normal rate and regular rhythm.     Pulses: Normal pulses.     Heart sounds: Normal heart sounds. No murmur heard.    No friction rub. No gallop.  Pulmonary:     Effort: Pulmonary effort is normal. No respiratory distress.     Breath sounds: Normal breath sounds.  Abdominal:     General: Bowel sounds are normal. There is no distension.     Palpations: Abdomen is soft. There is no mass.     Tenderness: There is no abdominal tenderness. There is no guarding.  Genitourinary:    Comments: No cva tenderness.  Musculoskeletal:        General: No  swelling or tenderness.     Cervical back: Normal range of motion and neck supple. No rigidity. No muscular tenderness.     Right lower leg: No edema.     Left lower leg: No edema.  Skin:    General: Skin is warm and dry.     Findings: No rash.  Neurological:     Mental Status: She is alert.     Comments: Alert, speech normal. Motor/sens grossly intact bil.   Psychiatric:        Mood and Affect: Mood normal.     ED Results / Procedures / Treatments   Labs (all labs ordered are listed, but only abnormal results are displayed) Results for orders placed or performed during the hospital encounter of 12/10/23  CBC with Differential   Collection Time: 12/10/23 12:35 PM  Result Value Ref Range   WBC 5.9 4.0 - 10.5 K/uL   RBC 4.84 3.87 - 5.11 MIL/uL   Hemoglobin 13.9 12.0 - 15.0 g/dL   HCT 40.9 81.1 - 91.4 %   MCV 88.4 80.0 - 100.0 fL   MCH 28.7 26.0 - 34.0 pg   MCHC 32.5 30.0 - 36.0 g/dL   RDW 78.2 95.6 - 21.3 %   Platelets 217 150 - 400 K/uL   nRBC 0.0 0.0 - 0.2 %   Neutrophils Relative % 64 %   Neutro Abs 3.8 1.7 - 7.7 K/uL   Lymphocytes Relative 22 %   Lymphs Abs 1.3 0.7 - 4.0 K/uL   Monocytes Relative 10 %   Monocytes Absolute 0.6 0.1 - 1.0 K/uL   Eosinophils Relative 3 %   Eosinophils Absolute 0.2 0.0 - 0.5 K/uL   Basophils Relative 1 %   Basophils Absolute 0.1 0.0 - 0.1 K/uL   Immature Granulocytes 0 %   Abs Immature Granulocytes 0.02 0.00 - 0.07 K/uL     EKG None  Radiology DG Chest Port 1 View Result Date: 12/10/2023 CLINICAL DATA:  Weakness EXAM: PORTABLE CHEST 1 VIEW COMPARISON:  01/14/2020 FINDINGS: Single frontal view of the chest demonstrates postsurgical changes from CABG. Cardiac silhouette is enlarged. No acute airspace disease, effusion, or pneumothorax. No acute bony abnormality. IMPRESSION: 1. No acute intrathoracic process. Electronically Signed   By: Bobbye Burrow M.D.   On: 12/10/2023 15:00    Procedures Procedures    Medications Ordered in  ED Medications  lactated ringers  bolus 1,000 mL (has no administration in time range)    ED Course/ Medical Decision Making/ A&P Clinical Course as of 12/10/23 1518  Mon Dec 10, 2023  1509 Stable 89 YOF with  a chief complaint of weakness Fatigue/AMS  workup in process. [CC]    Clinical Course User Index [CC] Onetha Bile, MD                                 Medical Decision Making Problems Addressed: Generalized weakness: acute illness or injury with systemic symptoms that poses a threat to life or bodily functions Lightheadedness: acute illness or injury with systemic symptoms that poses a threat to life or bodily functions Sinus bradycardia: acute illness or injury with systemic symptoms  Amount and/or Complexity of Data Reviewed Independent Historian:     Details: Family, hx External Data Reviewed: notes. Labs: ordered. Decision-making details documented in ED Course. Radiology: ordered and independent interpretation performed. Decision-making details documented in ED Course. ECG/medicine tests: ordered and independent interpretation performed. Decision-making details documented in ED Course.  Risk Decision regarding hospitalization.   Iv ns. Continuous pulse ox and cardiac monitoring. Labs ordered/sent. Imaging ordered.   Differential diagnosis includes dehydration, aki, anemia, uti, etc. Dispo decision including potential need for admission considered - will get labs and imaging and reassess.   Reviewed nursing notes and prior charts for additional history. External reports reviewed. Additional history from: family.   Cardiac monitor: sinus rhythm, rate 55.  Labs reviewed/interpreted by me - wbc and hgb normal. Other labs pending.   Xrays reviewed/interpreted by me - no pna.   1519, labs pending. Signed out to Dr Urban Garden to check pending labs, recheck pt, and dispo appropriately (given faintness, brady, gen weakness, anticipate probable admission).            Final Clinical Impression(s) / ED Diagnoses Final diagnoses:  None    Rx / DC Orders ED Discharge Orders     None         Guadalupe Lee, MD 12/10/23 1520

## 2023-12-10 NOTE — ED Triage Notes (Signed)
 Pt c/o dehydration, states "I don't drink- never have but now that I'm 89 it's probably catching up to me."  Lightheaded/ fatigue, HTN, urine is "canary yellow."

## 2024-01-24 DIAGNOSIS — K219 Gastro-esophageal reflux disease without esophagitis: Secondary | ICD-10-CM | POA: Diagnosis not present

## 2024-01-24 DIAGNOSIS — Z23 Encounter for immunization: Secondary | ICD-10-CM | POA: Diagnosis not present

## 2024-01-24 DIAGNOSIS — E039 Hypothyroidism, unspecified: Secondary | ICD-10-CM | POA: Diagnosis not present

## 2024-01-24 DIAGNOSIS — E7800 Pure hypercholesterolemia, unspecified: Secondary | ICD-10-CM | POA: Diagnosis not present

## 2024-01-24 DIAGNOSIS — M810 Age-related osteoporosis without current pathological fracture: Secondary | ICD-10-CM | POA: Diagnosis not present

## 2024-01-24 DIAGNOSIS — F03A0 Unspecified dementia, mild, without behavioral disturbance, psychotic disturbance, mood disturbance, and anxiety: Secondary | ICD-10-CM | POA: Diagnosis not present

## 2024-01-24 DIAGNOSIS — Z79899 Other long term (current) drug therapy: Secondary | ICD-10-CM | POA: Diagnosis not present

## 2024-01-24 DIAGNOSIS — Z6821 Body mass index (BMI) 21.0-21.9, adult: Secondary | ICD-10-CM | POA: Diagnosis not present

## 2024-01-24 DIAGNOSIS — I10 Essential (primary) hypertension: Secondary | ICD-10-CM | POA: Diagnosis not present

## 2024-02-21 DIAGNOSIS — H6121 Impacted cerumen, right ear: Secondary | ICD-10-CM | POA: Diagnosis not present

## 2024-02-21 DIAGNOSIS — Z6820 Body mass index (BMI) 20.0-20.9, adult: Secondary | ICD-10-CM | POA: Diagnosis not present

## 2024-02-25 DIAGNOSIS — M810 Age-related osteoporosis without current pathological fracture: Secondary | ICD-10-CM | POA: Diagnosis not present

## 2024-02-27 ENCOUNTER — Telehealth (HOSPITAL_COMMUNITY): Payer: Self-pay

## 2024-02-27 NOTE — Telephone Encounter (Signed)
 Auth Submission: NO AUTH NEEDED Site of care: Site of care: [facility] Payer: [employer] Medication & CPT/J Code(s) submitted: Reclast  (Zolendronic acid) J3489 Diagnosis Code:  Route of submission (phone, fax, portal):  Phone # Fax # Auth type: Buy/Bill HB Units/visits requested: 5mg  x 1 dose Reference number:  Approval from: 02/27/24 to 08/13/24

## 2024-03-13 ENCOUNTER — Encounter (HOSPITAL_COMMUNITY)

## 2024-03-19 DIAGNOSIS — Z6821 Body mass index (BMI) 21.0-21.9, adult: Secondary | ICD-10-CM | POA: Diagnosis not present

## 2024-03-19 DIAGNOSIS — U071 COVID-19: Secondary | ICD-10-CM | POA: Diagnosis not present

## 2024-03-27 DIAGNOSIS — M810 Age-related osteoporosis without current pathological fracture: Secondary | ICD-10-CM | POA: Diagnosis not present

## 2024-05-07 DIAGNOSIS — H04123 Dry eye syndrome of bilateral lacrimal glands: Secondary | ICD-10-CM | POA: Diagnosis not present

## 2024-05-07 DIAGNOSIS — H524 Presbyopia: Secondary | ICD-10-CM | POA: Diagnosis not present

## 2024-05-07 DIAGNOSIS — H52203 Unspecified astigmatism, bilateral: Secondary | ICD-10-CM | POA: Diagnosis not present

## 2024-05-07 DIAGNOSIS — Z961 Presence of intraocular lens: Secondary | ICD-10-CM | POA: Diagnosis not present

## 2024-05-07 DIAGNOSIS — H35373 Puckering of macula, bilateral: Secondary | ICD-10-CM | POA: Diagnosis not present

## 2024-05-09 DIAGNOSIS — Z23 Encounter for immunization: Secondary | ICD-10-CM | POA: Diagnosis not present

## 2024-05-22 DIAGNOSIS — H6121 Impacted cerumen, right ear: Secondary | ICD-10-CM | POA: Diagnosis not present

## 2024-05-22 DIAGNOSIS — Z6821 Body mass index (BMI) 21.0-21.9, adult: Secondary | ICD-10-CM | POA: Diagnosis not present

## 2024-05-22 DIAGNOSIS — H9190 Unspecified hearing loss, unspecified ear: Secondary | ICD-10-CM | POA: Diagnosis not present

## 2024-07-21 DIAGNOSIS — M810 Age-related osteoporosis without current pathological fracture: Secondary | ICD-10-CM | POA: Diagnosis not present

## 2024-07-21 DIAGNOSIS — E039 Hypothyroidism, unspecified: Secondary | ICD-10-CM | POA: Diagnosis not present

## 2024-07-21 DIAGNOSIS — Z79899 Other long term (current) drug therapy: Secondary | ICD-10-CM | POA: Diagnosis not present

## 2024-09-03 ENCOUNTER — Telehealth: Payer: Self-pay | Admitting: Emergency Medicine

## 2024-09-03 DIAGNOSIS — I351 Nonrheumatic aortic (valve) insufficiency: Secondary | ICD-10-CM

## 2024-09-03 NOTE — Telephone Encounter (Signed)
 Re-entered ECHO d/t previous order expiring before patient able to make appointment

## 2024-10-10 ENCOUNTER — Ambulatory Visit (HOSPITAL_COMMUNITY)
